# Patient Record
Sex: Male | Born: 1990
Health system: Southern US, Community
[De-identification: ages and names within clinical notes are randomized; demographics above are authoritative.]

## PROBLEM LIST (undated history)

## (undated) DIAGNOSIS — J302 Other seasonal allergic rhinitis: Secondary | ICD-10-CM

## (undated) HISTORY — PX: OTHER SURGICAL HISTORY: SHX169

## (undated) HISTORY — PX: LEG SURGERY: SHX1003

---
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
## 2010-01-30 ENCOUNTER — Emergency Department (HOSPITAL_COMMUNITY): Admission: EM | Admit: 2010-01-30 | Discharge: 2010-01-30 | Payer: Self-pay | Admitting: Emergency Medicine

## 2010-06-06 LAB — GC/CHLAMYDIA PROBE AMP, GENITAL
Chlamydia, DNA Probe: POSITIVE — AB
GC Probe Amp, Genital: POSITIVE — AB

## 2010-07-02 LAB — GC/CHLAMYDIA PROBE AMP, GENITAL
Chlamydia, DNA Probe: NEGATIVE
GC Probe Amp, Genital: POSITIVE — AB

## 2010-07-02 LAB — POCT URINALYSIS DIP (DEVICE)
Bilirubin Urine: NEGATIVE
Glucose, UA: NEGATIVE mg/dL
Ketones, ur: NEGATIVE mg/dL
Nitrite: NEGATIVE
Protein, ur: 30 mg/dL — AB
Specific Gravity, Urine: 1.02 (ref 1.005–1.030)
Urobilinogen, UA: 1 mg/dL (ref 0.0–1.0)
pH: 7.5 (ref 5.0–8.0)

## 2010-07-02 LAB — RPR: RPR Ser Ql: NONREACTIVE

## 2010-07-02 LAB — URINE CULTURE
Colony Count: NO GROWTH
Culture: NO GROWTH

## 2010-07-02 LAB — HIV ANTIBODY (ROUTINE TESTING W REFLEX): HIV: NONREACTIVE

## 2010-08-08 NOTE — Op Note (Signed)
NAMEMarland Kitchen  YARIEL, FERRARIS NO.:  [id number]   MEDICAL RECORD NO.:  [id number]          PATIENT TYPE:  INP   LOCATION:  1615                         FACILITY:  [facility]   PHYSICIAN:  Almedia Balls. Ranell Patrick, M.D. DATE OF BIRTH:  1990-06-22   DATE OF PROCEDURE:  01/17/2008  DATE OF DISCHARGE:                               OPERATIVE REPORT   PREOPERATIVE DIAGNOSIS:  Right displaced tibia-fibula fracture.   POSTOPERATIVE DIAGNOSIS:  Right displaced tibia-fibula fracture.   PROCEDURE PERFORMED:  Intramedullary nailing of right tibia-fibula  fracture.   ATTENDING SURGEON:  Almedia Balls. Ranell Patrick, M.D.   ASSISTANT:  Donnie Coffin. Durwin Nora, P.A.   ANESTHESIA:  General anesthesia was used.   ESTIMATED BLOOD LOSS:  100 mL.   FLUID REPLACEMENT:  1000 cc crystalloid.   Instrument count was correct.  There were no complications.  Perioperative antibiotics were given.   INDICATIONS:  The patient is a 20 year old male who suffered a displaced  tib-fib fracture in a football game last night.  The patient was  admitted and stabilized by Dr. Worthy Rancher, compartments supple  overnight.  The patient now for surgery for stabilization of his  fracture with intramedullary nailing.  Risks, benefits, and alternatives  of surgery discussed with the patient's family and the patient elected  to proceed with surgery.  Informed consent was obtained.   DESCRIPTION OF PROCEDURE:  After an adequate level of anesthesia was  achieved, the patient was positioned supine on a radiolucent table, a  nonsterile tourniquet placed on the right proximal thigh, right leg  sterilely prepped and draped in the usual manner.  The C-arm was  utilized through the case with both AP and lateral views to visualize  reduction and placement of the nail and locking screws.  We did not use  the tourniquet.  The knee was approached through an incision which  started proximal to the joint line, extending up along the patellar  tendon  over to the medial side of the patella.  Dissection was carried  down through subcutaneous tissues using Bovie.  We went in and split the  medial retinaculum.  We did not enter into or violate the joint.  We  identified the starting point for a tibial nail and introduced a guide  pin, visualized in both AP and lateral planes.  We then introduced the  step-cut drill and then used the awl to turn the corner and then placed  a ball-tip guidewire across the fracture site.  We reamed up to a size  10.  This was as tight a fit as we could get, and we measured this to a  34.5 nail.  We then inserted a 9-mm x 34.5 nail antegrade across the  fracture site and in good alignment.  At this point we then placed a  single oblique proximal interlocking screw.  I went ahead and made sure  that the fracture site was compressed and removed the proximal guide and  we placed an end cap, which is a 5-mm end cap.  We then went ahead and  placed our distal interlock screw  in using 4.5 screws using C-arm.  We  were happy with our fracture alignment and we were happy with our screw  placement and also the length of our nail.  At this point we thoroughly  irrigated the wounds, closed  the proximal wound with #1 Vicryl suture, followed by 2-0 Vicryl,  followed by staples, and then the distal wounds with 2-0 Vicryl and  staples.  A sterile compressive bandage and short-leg splint were  applied.  The patient tolerated the surgery well.      Almedia Balls. Ranell Patrick, M.D.  Electronically Signed     SRN/MEDQ  D:  01/17/2008  T:  01/17/2008  Job:  [id number]

## 2010-08-08 NOTE — H&P (Signed)
NAMEMarland Kitchen  Patrick Atkins, Patrick Atkins NO.:  [id number]   MEDICAL RECORD NO.:  [id number]          PATIENT TYPE:  INP   LOCATION:  1615                         FACILITY:  [facility]   PHYSICIAN:  Georges Lynch. Gioffre, M.D.DATE OF BIRTH:  May 01, 1990   DATE OF ADMISSION:  01/16/2008  DATE OF DISCHARGE:                              HISTORY & PHYSICAL   ADMISSION DIAGNOSIS:  Distal right tib-fib fracture from football  injury.   HISTORY OF PRESENT ILLNESS:  The patient is a 20 year old gentleman  playing high school football this evening on defense, took a hit to his  right lower extremity.  He had extreme pain and deformity.  He was  placed in a splint by the athletic trainers, transported to [facility]  Emergency Room where it was found he had an angulated fracture of the  distal tib and fib with displacement.  The patient was given some  narcotics in the emergency room, traction was pulled, it was reduced.  He was placed in a short leg cast which was bivalved.  Postreduction  films shows he has much improved alignment.  Postreduction casting, he  is got good sensation in his toes, good movement.  He is very  comfortable in the cast.   ALLERGIES:  No known drug allergies.   CURRENT MEDICAL HISTORY:  None.   PAST SURGICAL HISTORY:  Right thumb surgery.   SOCIAL HISTORY:  The patient is a high Ec[employer], lives with his  family.   Review of systems is negative for any respiratory, cardiac, general, or  GU problems.   PHYSICAL EXAMINATION:  GENERAL:  The patient is a healthy-appearing,  well-developed 20 year old black male, conscious, alert, and  appropriate, lying on a hospital gurney.  HEENT:  He was normocephalic.  Pupils equal, round, and reactive.  Gross  hearing is intact.  NECK:  Good motion.  CHEST:  Lung sounds were clear throughout.  HEART:  Regular rate and rhythm.  ABDOMEN:  Soft, nontender.  EXTREMITIES:  His upper extremities had full range of motion without  any  deformities or discomfort.  Lower Extremities:  Left lower extremity has  got full range of motion of his hip, knee, and ankle without any  discomfort or deformity.  Right ankle was angulated, swollen.  The  distal foot was neuroendovascularly intact prior to casting.  Post-  casting, his toes were exposed.  They are warm, blanch briskly.  He has  got good sensation.  NEUROLOGIC:  The patient was conscious, alert, and appropriate.   X-rays taken in the ER showed that he had a displaced angulated distal  tib-fib fracture.  Postreduction films shows improved alignment and  reduction.   PLAN:  The patient will be admitted for pain control.  He will also be  taken to the OR the following date by Dr. Malon Kindle for an IM rod of  his right tibia.      Jamelle Rushing, P.A.    ______________________________  Georges Lynch Darrelyn Hillock, M.D.    RWK/MEDQ  D:  01/16/2008  T:  01/17/2008  Job:  [id number]  cc:   Almedia Balls. Ranell Patrick, M.D.

## 2010-08-11 NOTE — Op Note (Signed)
NAME:  Patrick Atkins, Patrick Atkins                ACCOUNT NO.:  [id number]   MEDICAL RECORD NO.:  [id number]          PATIENT TYPE:  AMB   LOCATION:  DSC                          FACILITY:  [facility]   PHYSICIAN:  Rodney A. Mortenson, M.D.DATE OF BIRTH:  24-May-1990   DATE OF PROCEDURE:  11/30/2004  DATE OF DISCHARGE:                                 OPERATIVE REPORT   PREOPERATIVE DIAGNOSIS:  Displaced fracture, base of right thumb metacarpal.   POSTOPERATIVE DIAGNOSIS:  Displaced fracture, base of right thumb  metacarpal.   OPERATION:  Open reduction and internal fixation using pin fixation, right  thumb.   SURGEON:  Lenard Galloway. Chaney Malling, M.D.   ANESTHESIA:  General.   PROCEDURE:  The patient was placed on the operating table in the supine  position.  Pneumatic tourniquet was placed about the right upper extremity.  After satisfactory general anesthesia, the right upper extremity was prepped  with DuraPrep and draped down in the usual manner.  The arm was wrapped with  an Esmarch and a tourniquet was elevated.  Incision was made over the dorsal  ulnar side at the base of the right thumb.  Skin edges were retracted.  Blunt dissection was carried down to the metacarpal itself.  The bone was  almost immediately subcutaneous.  Superficial cutaneous nerves were very  carefully pushed out of the way and protected as was the extensor tendon to  the thumb.   Dissection carried down to the thumb and visualized of the fracture was  achieved.  This could be reduced almost to an anatomic position.  This was  held in a reduced position and two pins were placed across the fracture site  itself.  This stabilized the thumb quite nicely.   The reduction and pin fixation were confirmed with a C-arm throughout the  operative procedure.  The skin edges were then closed with a running 4-0  nylon suture.  Marcaine was placed about the wound.  A large bulky pressure  dressing was applied.  The patient was returned to  the recovery room in  excellent condition, tolerating this procedure extremely well.  I am very  pleased with surgical outcome.   DISPOSITION:  1.  Return to my office on Wednesday.  2.  Percocet for pain.           ______________________________  Lenard Galloway. Chaney Malling, M.D.     RAM/MEDQ  D:  11/30/2004  T:  11/30/2004  Job:  [id number]

## 2010-12-26 LAB — COMPREHENSIVE METABOLIC PANEL
ALT: 20
AST: 34
Albumin: 4.3
Alkaline Phosphatase: 118
BUN: 15
CO2: 22
Calcium: 9.2
Chloride: 108
Creatinine, Ser: 1.12
Glucose, Bld: 120 — ABNORMAL HIGH
Potassium: 3.7
Sodium: 138
Total Bilirubin: 1.2
Total Protein: 6.7

## 2010-12-26 LAB — CBC
HCT: 39.1
HCT: 42.8
Hemoglobin: 12.5
Hemoglobin: 13.5
MCHC: 31.5
MCHC: 31.9
MCV: 72.1 — ABNORMAL LOW
MCV: 73 — ABNORMAL LOW
Platelets: 284
Platelets: 371
RBC: 5.36
RBC: 5.94 — ABNORMAL HIGH
RDW: 13.6
RDW: 15.1
WBC: 14.8 — ABNORMAL HIGH
WBC: 8.6

## 2010-12-26 LAB — DIFFERENTIAL
Basophils Absolute: 0.1
Basophils Relative: 1
Eosinophils Absolute: 0
Eosinophils Relative: 0
Lymphocytes Relative: 10 — ABNORMAL LOW
Lymphs Abs: 1.5
Monocytes Absolute: 0.9
Monocytes Relative: 6
Neutro Abs: 12.3 — ABNORMAL HIGH
Neutrophils Relative %: 83 — ABNORMAL HIGH

## 2010-12-26 LAB — APTT: aPTT: 28

## 2010-12-26 LAB — PROTIME-INR
INR: 1.3
Prothrombin Time: 17.1 — ABNORMAL HIGH

## 2011-08-23 ENCOUNTER — Ambulatory Visit: Payer: 59 | Admitting: Family Medicine

## 2011-08-23 VITALS — BP 133/94 | HR 59 | Temp 98.9°F | Resp 18 | Ht 67.0 in | Wt 188.0 lb

## 2011-08-23 DIAGNOSIS — H109 Unspecified conjunctivitis: Secondary | ICD-10-CM

## 2011-08-23 MED ORDER — POLYMYXIN B-TRIMETHOPRIM 10000-0.1 UNIT/ML-% OP SOLN: 1.0000 [drp] | OPHTHALMIC | Status: AC

## 2011-08-23 NOTE — Progress Notes (Signed)
  Patient Name: Patrick Atkins Date of Birth: 05-17-1990 Medical Record Number: [id number] Gender: male Date of Encounter: 08/23/2011  History of Present Illness:  Patrick Atkins is a 21 y.o. very pleasant male patient who presents with the following:  About 2 days ago he noted a feeling like "an eyelash" was in his right eye.  The eye has continued to be irritated and red- it was stuck together yesterday morning, today more runny.  He did try and remove any FB but did not find anything, today there is a FB sensation Vision is ok.  He wears glasses but not contacts.   Otherwise generally healthy. NKDA Works at [employer]    There is no problem list on file for this patient.  No past medical history on file. No past surgical history on file. History  Substance Use Topics  . Smoking status: Current Everyday Smoker  . Smokeless tobacco: Not on file  . Alcohol Use: Not on file   No family history on file. No Known Allergies  Medication list has been reviewed and updated.  Prior to Admission medications   Not on File    Review of Systems: As per HPI- otherwise negative.   Physical Examination: Filed Vitals:   08/23/11 1156  BP: 133/94  Pulse: 59  Temp: 98.9 F (37.2 C)  Resp: 18   Filed Vitals:   08/23/11 1156  Height: 5\' 7"  (1.702 m)  Weight: 188 lb (85.276 kg)   Body mass index is 29.44 kg/(m^2).  GEN: WDWN, NAD, Non-toxic, A & O x 3 HEENT: Atraumatic, Normocephalic. Neck supple. No masses, No LAD. PEERL, EOMI.  His right eye shows conjunctival redness, discharge, and lid swelling.  Negative fluorecin testing Ears and Nose: No external deformity. CV: RRR, No M/G/R. No JVD. No thrill. No extra heart sounds. PULM: CTA B, no wheezes, crackles, rhonchi. No retractions. No resp. distress. No accessory muscle use. EXTR: No c/c/e NEURO Normal gait.  PSYCH: Normally interactive. Conversant. Not depressed or anxious appearing.  Calm demeanor.    Assessment and Plan: 1.  Conjunctivitis  trimethoprim-polymyxin b (POLYTRIM) ophthalmic solution   Will treat as above- Patient (or parent if minor) instructed to return to clinic or call if not better in 2 day(s).  Gave note for work also.     Abbe Amsterdam, MD 08/23/2011 12:31 PM

## 2011-09-06 ENCOUNTER — Ambulatory Visit (INDEPENDENT_AMBULATORY_CARE_PROVIDER_SITE_OTHER): Payer: 59 | Admitting: Family Medicine

## 2011-09-06 VITALS — BP 139/78 | HR 54 | Temp 98.0°F | Resp 16 | Ht 66.78 in | Wt 183.2 lb

## 2011-09-06 DIAGNOSIS — H1010 Acute atopic conjunctivitis, unspecified eye: Secondary | ICD-10-CM

## 2011-09-06 DIAGNOSIS — H1045 Other chronic allergic conjunctivitis: Secondary | ICD-10-CM

## 2011-09-06 MED ORDER — AZELASTINE HCL 0.05 % OP SOLN: 1.0000 [drp] | Freq: Two times a day (BID) | OPHTHALMIC | Status: AC

## 2011-09-06 NOTE — Progress Notes (Signed)
  Subjective:    Patient ID: Patrick Atkins, male    DOB: Aug 14, 1990, 21 y.o.   MRN: [id number]  HPI Patrick Atkins is a 21 y.o. male Seen 08/23/11 for R conjunctivitis, treated with polytrim gtts.   Noted in L eye few days later.    Drops in both eyes.  Now with sensitivity to light.  Red at times. Working at night.  Hurt during day.  Watery eyes.  No sneezing.  No fever.  Vision ok with glasses, but eyelids heavy. Still using polytrim - up to every 3 -4 hours.    Review of Systems  Constitutional: Negative for fever and chills.  HENT: Negative for congestion, postnasal drip and sinus pressure.   Respiratory: Negative for cough.   Skin: Negative for rash.       Objective:   Physical Exam  Constitutional: He is oriented to person, place, and time. He appears well-developed and well-nourished.  HENT:  Head: Normocephalic and atraumatic.  Right Ear: External ear normal.  Left Ear: External ear normal.  Nose: Nose normal.  Mouth/Throat: No oropharyngeal exudate.  Eyes: EOM are normal. Pupils are equal, round, and reactive to light. Right eye exhibits discharge. Right conjunctiva is injected. Left conjunctiva is injected.       R eye watering/clear d/c.  Pulmonary/Chest: Effort normal.  Neurological: He is alert and oriented to person, place, and time.  Skin: Skin is warm and dry.  Psychiatric: He has a normal mood and affect.          Assessment & Plan:  Patrick Atkins is a 21 y.o. male 1. Conjunctivitis, allergic    Stop abx drops and otc red eye drops.  May be causing secondary irritation.  Stop marijuana use. rtc precautions. Patient Instructions  Stop antibiotic drops and red eye drops.  Start allergy drops, and Refresh eye drops as needed throughout the day.  If not improving in 4-5 days, recheck.  If any discolored drainage from the eyes, or problems with your vision - recheck sooner.

## 2011-09-06 NOTE — Patient Instructions (Signed)
Stop antibiotic drops and red eye drops.  Start allergy drops, and Refresh eye drops as needed throughout the day.  If not improving in 4-5 days, recheck.  If any discolored drainage from the eyes, or problems with your vision - recheck sooner.

## 2011-11-23 ENCOUNTER — Ambulatory Visit: Payer: 59 | Admitting: Family Medicine

## 2011-11-23 VITALS — BP 142/86 | HR 53 | Temp 97.4°F | Resp 16 | Ht 67.0 in | Wt 188.0 lb

## 2011-11-23 DIAGNOSIS — T148 Other injury of unspecified body region: Secondary | ICD-10-CM

## 2011-11-23 DIAGNOSIS — L0390 Cellulitis, unspecified: Secondary | ICD-10-CM

## 2011-11-23 DIAGNOSIS — L0291 Cutaneous abscess, unspecified: Secondary | ICD-10-CM

## 2011-11-23 DIAGNOSIS — W57XXXA Bitten or stung by nonvenomous insect and other nonvenomous arthropods, initial encounter: Secondary | ICD-10-CM

## 2011-11-23 MED ORDER — SULFAMETHOXAZOLE-TRIMETHOPRIM 800-160 MG PO TABS: 1.0000 | ORAL_TABLET | Freq: Two times a day (BID) | ORAL | Status: AC

## 2011-11-23 NOTE — Patient Instructions (Addendum)
Return if in all worse or if it is just not improving.  Take the antibiotics faithfully for the full 10 days

## 2011-11-23 NOTE — Progress Notes (Signed)
Subjective: Patient has developed a soreness left wrist. He woke up with what he thought was a sting there. He's been cleaning it with alcohol and then with peroxide. It has developed a little cavity and draining.  Objective: Abscess left wrist which is draining. Culture was taken. No other lesions.  Assessment: Skin abscess, probably MRSA Possible insect bite originally  Plan: Bactrim DS twice a day Keep it covered at work especially. He is to wash and clean and a couple times a day and dress it.

## 2011-11-27 LAB — WOUND CULTURE
Gram Stain: NONE SEEN
Gram Stain: NONE SEEN
Gram Stain: NONE SEEN

## 2012-09-09 ENCOUNTER — Emergency Department (HOSPITAL_COMMUNITY): Payer: 59

## 2012-09-09 ENCOUNTER — Encounter (HOSPITAL_COMMUNITY): Payer: Self-pay | Admitting: Emergency Medicine

## 2012-09-09 ENCOUNTER — Emergency Department (HOSPITAL_COMMUNITY)
Admission: EM | Admit: 2012-09-09 | Discharge: 2012-09-09 | Disposition: A | Discharge status: Home / Self Care | Payer: 59 | Attending: Emergency Medicine | Admitting: Emergency Medicine

## 2012-09-09 DIAGNOSIS — Z23 Encounter for immunization: Secondary | ICD-10-CM | POA: Insufficient documentation

## 2012-09-09 DIAGNOSIS — F17200 Nicotine dependence, unspecified, uncomplicated: Secondary | ICD-10-CM | POA: Insufficient documentation

## 2012-09-09 DIAGNOSIS — IMO0002 Reserved for concepts with insufficient information to code with codable children: Secondary | ICD-10-CM

## 2012-09-09 DIAGNOSIS — S058X9A Other injuries of unspecified eye and orbit, initial encounter: Secondary | ICD-10-CM | POA: Insufficient documentation

## 2012-09-09 HISTORY — DX: Other seasonal allergic rhinitis: J30.2

## 2012-09-09 MED ORDER — IBUPROFEN 800 MG PO TABS
800.0000 mg | ORAL_TABLET | Freq: Once | ORAL | Status: AC
  Administered 2012-09-09: 800 mg via ORAL
  Filled 2012-09-09: qty 1

## 2012-09-09 MED ORDER — IBUPROFEN 800 MG PO TABS: 800.0000 mg | ORAL_TABLET | Freq: Three times a day (TID) | ORAL | Status: DC

## 2012-09-09 MED ORDER — BACITRACIN ZINC 500 UNIT/GM EX OINT
1.0000 "application " | TOPICAL_OINTMENT | Freq: Once | CUTANEOUS | Status: AC
  Administered 2012-09-09: 1 via TOPICAL

## 2012-09-09 MED ORDER — TETANUS-DIPHTH-ACELL PERTUSSIS 5-2.5-18.5 LF-MCG/0.5 IM SUSP
0.5000 mL | Freq: Once | INTRAMUSCULAR | Status: AC
  Administered 2012-09-09: 0.5 mL via INTRAMUSCULAR
  Filled 2012-09-09: qty 0.5

## 2012-09-09 NOTE — ED Notes (Signed)
The patient is AOx4 and comfortable with the discharge instructions. 
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
## 2012-09-09 NOTE — ED Notes (Signed)
MD at bedside. 

## 2012-10-12 ENCOUNTER — Emergency Department (HOSPITAL_BASED_OUTPATIENT_CLINIC_OR_DEPARTMENT_OTHER)
Admission: EM | Admit: 2012-10-12 | Discharge: 2012-10-12 | Disposition: A | Discharge status: Home / Self Care | Payer: 59 | Attending: Emergency Medicine | Admitting: Emergency Medicine

## 2012-10-12 ENCOUNTER — Encounter (HOSPITAL_BASED_OUTPATIENT_CLINIC_OR_DEPARTMENT_OTHER): Payer: Self-pay | Admitting: Emergency Medicine

## 2012-10-12 DIAGNOSIS — Z4802 Encounter for removal of sutures: Secondary | ICD-10-CM | POA: Insufficient documentation

## 2012-10-12 DIAGNOSIS — F17200 Nicotine dependence, unspecified, uncomplicated: Secondary | ICD-10-CM | POA: Insufficient documentation

## 2012-10-12 NOTE — ED Notes (Signed)
Pt has sutures above the left eye directly below eyebrow. Pt states he was supposed to get them removed a month ago. Pt A/Ox4, denies pain, denies any other medical problem.

## 2012-10-12 NOTE — ED Provider Notes (Signed)
   History    CSN: [id number] Arrival date & time 10/12/12  1152  First MD Initiated Contact with Patient 10/12/12 1251     Chief Complaint  Patient presents with  . Suture / Staple Removal   (Consider location/radiation/quality/duration/timing/severity/associated sxs/prior Treatment) HPI Comments: Patient presents for suture removal. Patient had 4 sutures placed above the left eyebrow on 09/09/12. He reports no complications with wound healing. Onset of symptoms acute. Course is constant. Nothing makes symptoms better or worse.  Patient is a 22 y.o. male presenting with suture removal. The history is provided by the patient.  Suture / Staple Removal Pertinent negatives include no fever, headaches, myalgias, nausea, rash or vomiting.   Past Medical History  Diagnosis Date  . Seasonal allergies    History reviewed. No pertinent past surgical history. History reviewed. No pertinent family history. History  Substance Use Topics  . Smoking status: Current Every Day Smoker  . Smokeless tobacco: Not on file  . Alcohol Use: Yes    Review of Systems  Constitutional: Negative for fever.  Gastrointestinal: Negative for nausea and vomiting.  Musculoskeletal: Negative for myalgias.  Skin: Negative for rash.  Neurological: Negative for headaches.    Allergies  Review of patient's allergies indicates no known allergies.  Home Medications   Current Outpatient Rx  Name  Route  Sig  Dispense  Refill  . ibuprofen (ADVIL,MOTRIN) 800 MG tablet   Oral   Take 1 tablet (800 mg total) by mouth 3 (three) times daily.   21 tablet   0    BP 149/64  Pulse 59  Temp(Src) 98.5 F (36.9 C) (Oral)  Resp 18  Ht 5\' 6"  (1.676 m)  Wt 188 lb (85.276 kg)  BMI 30.36 kg/m2  SpO2 100%  Physical Exam  Nursing note and vitals reviewed. Constitutional: He appears well-developed and well-nourished.  HENT:  Head: Normocephalic and atraumatic.  Eyes: Conjunctivae are normal.  Neck: Normal range  of motion. Neck supple.  Pulmonary/Chest: No respiratory distress.  Neurological: He is alert.  Skin: Skin is warm and dry.  Well healing wounds superior to left eyebrow with 4 Prolene sutures in place. No drainage or signs of infection.  Psychiatric: He has a normal mood and affect.    ED Course  Procedures (including critical care time) Labs Reviewed - No data to display No results found. 1. Visit for suture removal     1:23 PM Patient seen and examined. 4 Prolene sutures removed without complication.   Vital signs reviewed and are as follows: Filed Vitals:   10/12/12 1203  BP: 149/64  Pulse: 59  Temp: 98.5 F (36.9 C)  Resp: 18      MDM  Visit for suture removal.  Renne Crigler, PA-C 10/12/12 1327

## 2012-10-13 NOTE — ED Provider Notes (Signed)
Medical screening examination/treatment/procedure(s) were performed by non-physician practitioner and as supervising physician I was immediately available for consultation/collaboration.   Calleen Alvis W. Jakobi Thetford, MD 10/13/12 1005 

## 2013-04-26 ENCOUNTER — Encounter (HOSPITAL_COMMUNITY): Payer: Self-pay | Admitting: Emergency Medicine

## 2013-04-26 ENCOUNTER — Emergency Department (HOSPITAL_COMMUNITY)
Admission: EM | Admit: 2013-04-26 | Discharge: 2013-04-26 | Disposition: A | Discharge status: Home / Self Care | Payer: 59 | Attending: Emergency Medicine | Admitting: Emergency Medicine

## 2013-04-26 DIAGNOSIS — K0889 Other specified disorders of teeth and supporting structures: Secondary | ICD-10-CM

## 2013-04-26 DIAGNOSIS — Z8709 Personal history of other diseases of the respiratory system: Secondary | ICD-10-CM | POA: Insufficient documentation

## 2013-04-26 DIAGNOSIS — K089 Disorder of teeth and supporting structures, unspecified: Secondary | ICD-10-CM | POA: Insufficient documentation

## 2013-04-26 DIAGNOSIS — F17200 Nicotine dependence, unspecified, uncomplicated: Secondary | ICD-10-CM | POA: Insufficient documentation

## 2013-04-26 DIAGNOSIS — Z791 Long term (current) use of non-steroidal anti-inflammatories (NSAID): Secondary | ICD-10-CM | POA: Insufficient documentation

## 2013-04-26 MED ORDER — PENICILLIN V POTASSIUM 500 MG PO TABS: 500.0000 mg | ORAL_TABLET | Freq: Four times a day (QID) | ORAL | Status: AC

## 2013-04-26 MED ORDER — HYDROCODONE-ACETAMINOPHEN 7.5-325 MG/15ML PO SOLN: 10.0000 mL | ORAL | Status: AC | PRN

## 2013-04-26 NOTE — ED Provider Notes (Signed)
Medical screening examination/treatment/procedure(s) were performed by non-physician practitioner and as supervising physician I was immediately available for consultation/collaboration.  EKG Interpretation   None         Gwyneth SproutWhitney Tashae Inda, MD 04/26/13 [phone]

## 2013-04-26 NOTE — Discharge Instructions (Signed)
Read the information below.  Use the prescribed medication as directed.  Please discuss all new medications with your pharmacist.  Do not take additional tylenol while taking the prescribed pain medication to avoid overdose.  You may return to the Emergency Department at any time for worsening condition or any new symptoms that concern you.  Please call the dentist listed above within 48 hours to schedule a close follow up appointment.  If you develop fevers, swelling in your face, difficulty swallowing or breathing, return to the ER immediately for a recheck.     Dental Pain A tooth ache may be caused by cavities (tooth decay). Cavities expose the nerve of the tooth to air and hot or cold temperatures. It may come from an infection or abscess (also called a boil or furuncle) around your tooth. It is also often caused by dental caries (tooth decay). This causes the pain you are having. DIAGNOSIS  Your caregiver can diagnose this problem by exam. TREATMENT   If caused by an infection, it may be treated with medications which kill germs (antibiotics) and pain medications as prescribed by your caregiver. Take medications as directed.  Only take over-the-counter or prescription medicines for pain, discomfort, or fever as directed by your caregiver.  Whether the tooth ache today is caused by infection or dental disease, you should see your dentist as soon as possible for further care. SEEK MEDICAL CARE IF: The exam and treatment you received today has been provided on an emergency basis only. This is not a substitute for complete medical or dental care. If your problem worsens or new problems (symptoms) appear, and you are unable to meet with your dentist, call or return to this location. SEEK IMMEDIATE MEDICAL CARE IF:   You have a fever.  You develop redness and swelling of your face, jaw, or neck.  You are unable to open your mouth.  You have severe pain uncontrolled by pain medicine. MAKE  SURE YOU:   Understand these instructions.  Will watch your condition.  Will get help right away if you are not doing well or get worse. Document Released: 03/12/2005 Document Revised: 06/04/2011 Document Reviewed: 10/29/2007 [facility] Patient Information 2014 [employer], [place].  Tooth Injuries The 3 most common tooth injuries are 1) fracture, 2) luxation (dislocated), and 3) avulsion (entire tooth comes out). Fracture. A fracture usually splits the tooth into 2 or more parts. Part of the tooth stays attached to the socket and 1 or more pieces of the tooth break free.  When the flesh inside the tooth (pulp) is injured, it can be identified by bleeding at the site or a pink or red dot in the dentin. Dentin is the yellowish second layer usually covered by enamel. Problems involving the dentin may be painful because the junction of the enamel and dentin is very sensitive. Limiting exposure of air, fluid in the mouth (saliva), temperature changes, and the tongue to tooth pulp will decrease the pain.  Fractures may be classified as:  Crown fractures. A crown fracture may involve the enamel only or the enamel and dentin. Sometimes crown fractures are broken down as simple (no pulp involvement) or complex (pulp involvement).  Root fractures. Root fractures almost always involve the pulp.  A combination of both. Luxation. Luxation means the tooth becomes dislocated within the socket but maintains some attachment. There are different types of luxations. They be identified by teeth that appear:  Longer than the surrounding teeth (extruded).  Positioned ahead of or behind  the normal tooth row (laterally displaced). With either injury, the tooth should be firmly grasped with a gloved hand and moved into its normal position. If the procedure is too difficult or too painful, the tooth should be left where it is for a dentist to reposition.   Pushed into the gum and appears shorter than the surrounding  teeth (intruded). Do not attempt to reposition an intruded tooth. With all luxated teeth, get to a dentist as soon as possible. Avulsion. Avulsion means the entire tooth is removed from its socket. The best outcomes require putting the tooth back in place within 60 minutes. After 2 hours, the chances of saving the tooth are small but getting to a dentist right away can be beneficial. Locate and protect the lost tooth. The tooth will often still be in the mouth, but if it cannot be located, check clothing, and the surrounding area. If dirty, the tooth should be gently cleaned with water or salty water (saline). To make saline combine  teaspoon of table salt in a cup of warm water. The tooth should be handled only on its enamel surface. The root should be protected from further injury. If the tooth cannot be repositioned into the socket after cleaning, transport the tooth in saliva, distilled water, or milk to the dentist. Avulsed baby (primary) teeth should not be reimplanted. TREATMENT   Gently biting into gauze or a towel will help control the bleeding. An exposed nerve requires dental exam and care.  Tooth fragments should be handled on their enamel surfaces, saved, and sent to the dental office with the injured person.  Minor fractures, involving only the enamel, usually do not require immediate dental treatment. A tooth can also be loosened by injury with no visible fracture or displacement. A person with this injury should be referred to a dentist for an X-ray exam to look for tooth fractures below the gum line.  Root fractures require joining the fractured tooth to a healthy tooth (splinting) by a dentist as soon as possible. If splinting is not possible, extraction of the remaining tooth may be necessary.  Only take over-the-counter or prescription medicines for pain, discomfort, or fever as directed by your caregiver.  If you are given antibiotics, finish all of them as directed. RISKS AND  COMPLICATIONS Complications from tooth injuries can include:  Tooth death.  Tooth loss.  Cosmetic deformity.  Infection. PREVENTION  Mouth guards should be worn in all contact sports. SEEK DENTAL CARE IF:  Pain becomes worse rather than better, or if pain is uncontrolled with medications.  You have increased swelling or redness in your face near the injured tooth.  You have an oral temperature above 102 F (38.9 C) not controlled by medicine.  You cannot open your mouth. Document Released: 12/08/2003 Document Revised: 06/04/2011 Document Reviewed: 06/16/2009 [facility] Patient Information 2014 [place], [place].

## 2013-04-26 NOTE — ED Provider Notes (Signed)
CSN: [id number]     Arrival date & time 04/26/13  0654 History   First MD Initiated Contact with Patient 04/26/13 [phone]     Chief Complaint  Patient presents with  . Dental Pain   (Consider location/radiation/quality/duration/timing/severity/associated sxs/prior Treatment) HPI Patient presents with left lower molar pain x 3 days.  Denies any known injury to the tooth but has had difficulty with this tooth in the past only when eating candy.  Three days ago he developed intermittent sharp pains in the tooth that are preventing him from eating.  Pain radiates into her left jaw.  Took aspirin and used orajel without relief.  Denies fevers, chills, sore throat, difficulty swallowing or breathing.  Denies facial swelling. Has a dentist Ned CardStacey Greene, plans to call her tomorrow to schedule an appointment.   Past Medical History  Diagnosis Date  . Seasonal allergies    History reviewed. No pertinent past surgical history. No family history on file. History  Substance Use Topics  . Smoking status: Current Every Day Smoker  . Smokeless tobacco: Not on file  . Alcohol Use: Yes    Review of Systems  Constitutional: Negative for fever, chills and appetite change.  HENT: Positive for dental problem. Negative for facial swelling, sore throat and trouble swallowing.   Respiratory: Negative for shortness of breath.   Musculoskeletal: Negative for neck stiffness.  Allergic/Immunologic: Negative for immunocompromised state.    Allergies  Review of patient's allergies indicates no known allergies.  Home Medications   Current Outpatient Rx  Name  Route  Sig  Dispense  Refill  . HYDROcodone-acetaminophen (HYCET) 7.5-325 mg/15 ml solution   Oral   Take 10 mLs by mouth every 4 (four) hours as needed for moderate pain.   150 mL   0   . ibuprofen (ADVIL,MOTRIN) 800 MG tablet   Oral   Take 1 tablet (800 mg total) by mouth 3 (three) times daily.   21 tablet   0   . penicillin v potassium  (VEETID) 500 MG tablet   Oral   Take 1 tablet (500 mg total) by mouth 4 (four) times daily.   40 tablet   0    BP 147/92  Pulse 69  Temp(Src) 97.6 F (36.4 C) (Oral)  Resp 16  SpO2 99% Physical Exam  Nursing note and vitals reviewed. Constitutional: He appears well-developed and well-nourished. No distress.  HENT:  Head: Normocephalic and atraumatic.  Mouth/Throat: Oropharynx is clear and moist. No oropharyngeal exudate.    Neck: Neck supple.  Pulmonary/Chest: Effort normal.  Neurological: He is alert.  Skin: He is not diaphoretic.    ED Course  Procedures (including critical care time) Labs Review Labs Reviewed - No data to display Imaging Review No results found.  EKG Interpretation   None       MDM   1. Pain, dental    Afebrile, nontoxic patient with new dental pain.  No obvious abscess.  Patient appears to have fracture of tooth, previously unknown.  No obvious abscess but pt with new pain in the setting of fractured tooth. Doubt deep space head or neck infection.  Doubt Ludwig's angina.  D/C home with pain medication and dental follow up.  Discussed findings, treatment, and follow up  with patient.  Pt given return precautions.  Pt verbalizes understanding and agrees with plan.        ScottsbluffEmily Clancy Mullarkey, PA-C 04/26/13 [phone]

## 2013-04-26 NOTE — ED Notes (Signed)
Pt. reports left lower molar pain for several days unrelieved by OTC pain medications .

## 2014-12-23 IMAGING — CT CT MAXILLOFACIAL W/O CM
3 series · 17 of 47 positions shown, 20 images · non-contrast
Comparison: Head CT 11/28/2006.

CLINICAL DATA: History of trauma.  Laceration over the left eye.

CT MAXILLOFACIAL WITHOUT CONTRAST
TECHNIQUE: Multidetector CT imaging of the maxillofacial
structures was performed. Multiplanar CT image reconstructions were
also generated.

[Series 3: orbit/facial 2.0 h30s · axial · 0.35mm/px · z∈[+1044,+1196]mm · 11 of 90 slices shown, 14 images]
[im 7/90  brain]
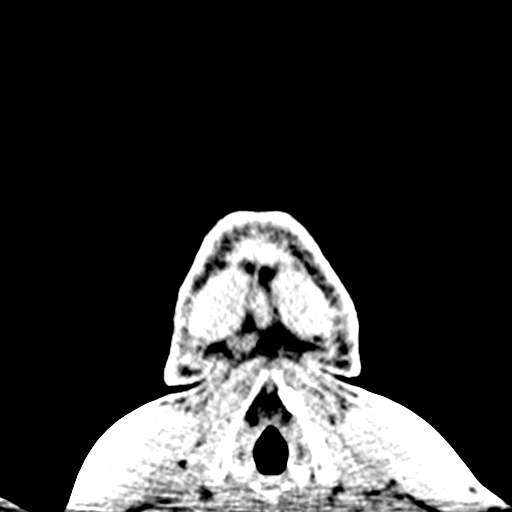
[im 7/90  bone]
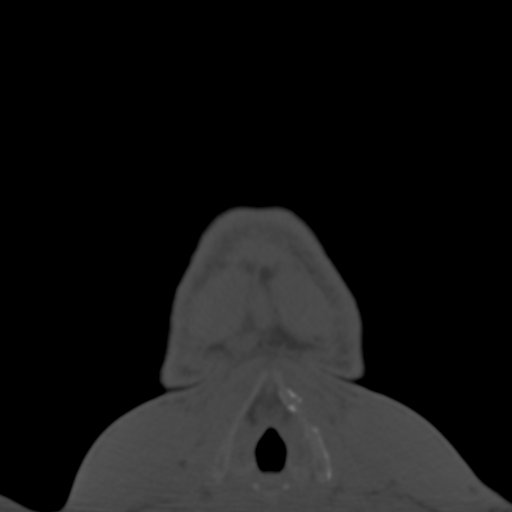
[im 13/90  bone]
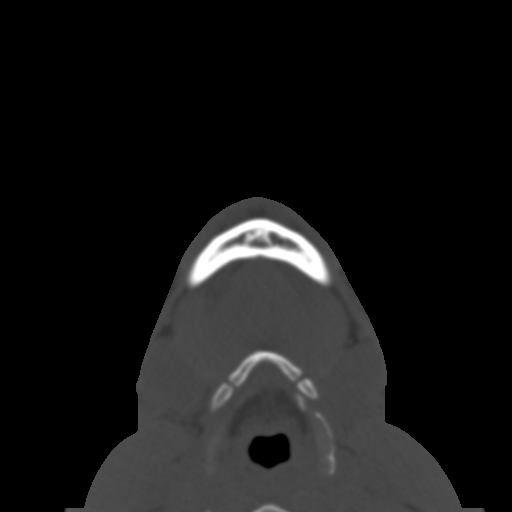
[im 22/90  bone]
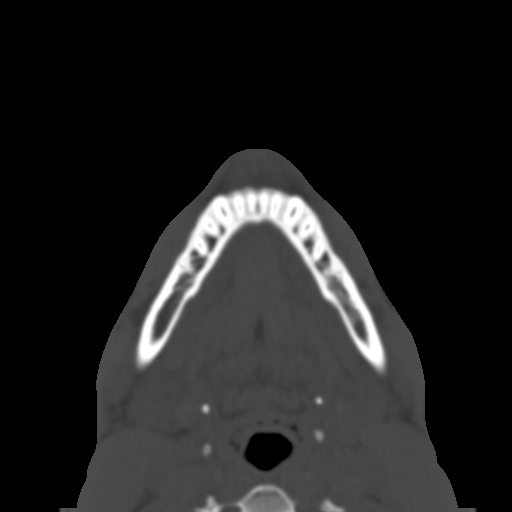
[im 28/90  bone]
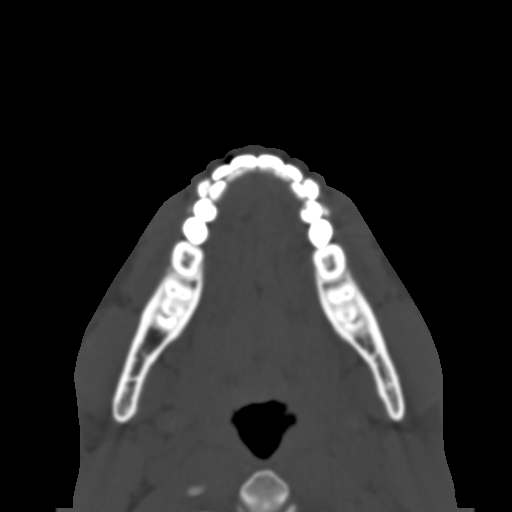
[im 37/90  brain]
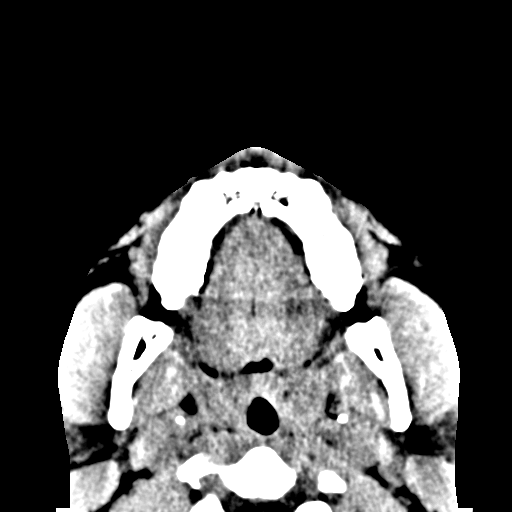
[im 37/90  bone]
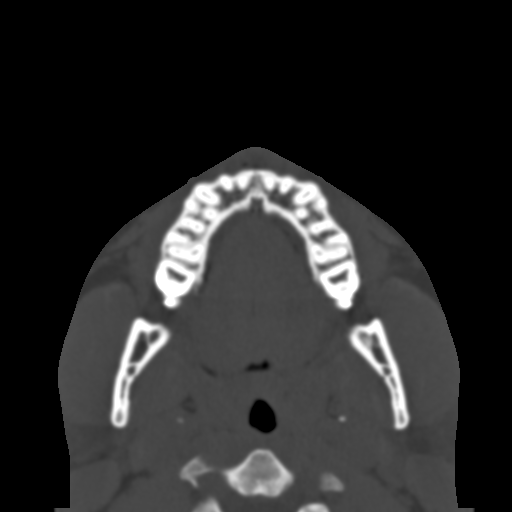
[im 47/90  bone]
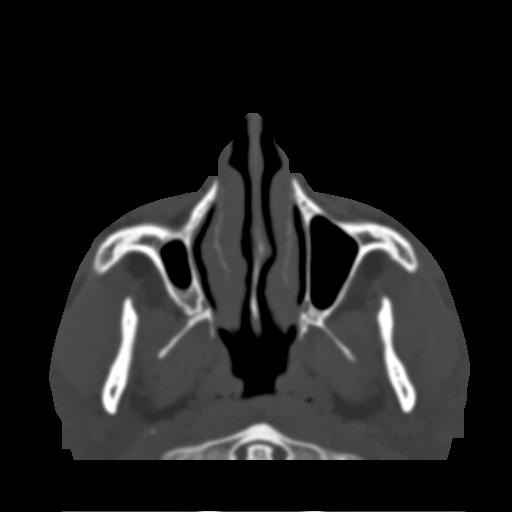
[im 53/90  bone]
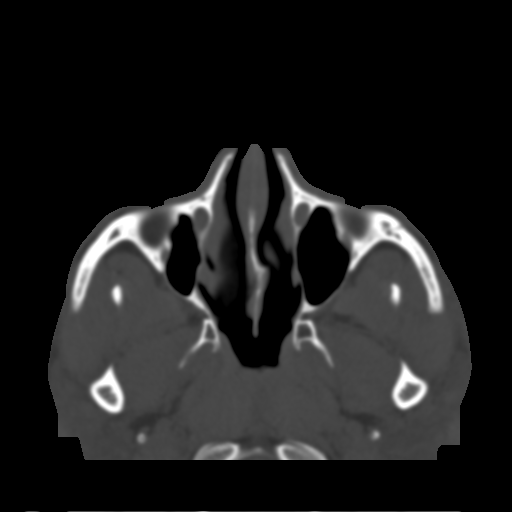
[im 62/90  bone]
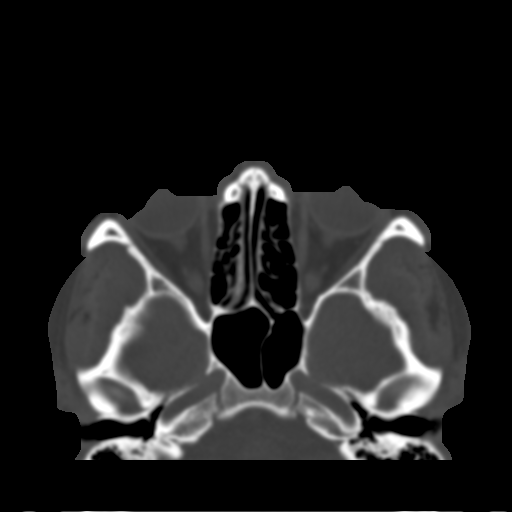
[im 68/90  brain]
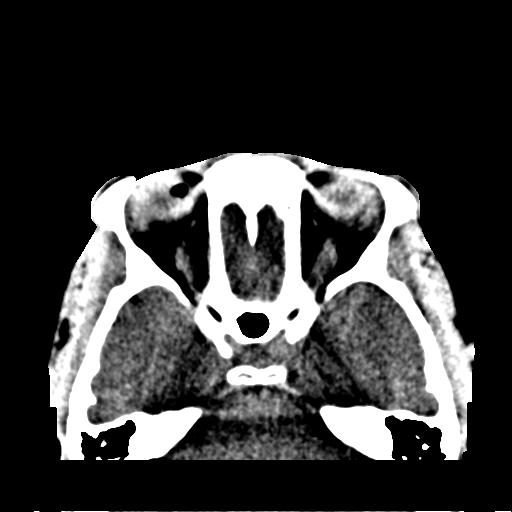
[im 68/90  bone]
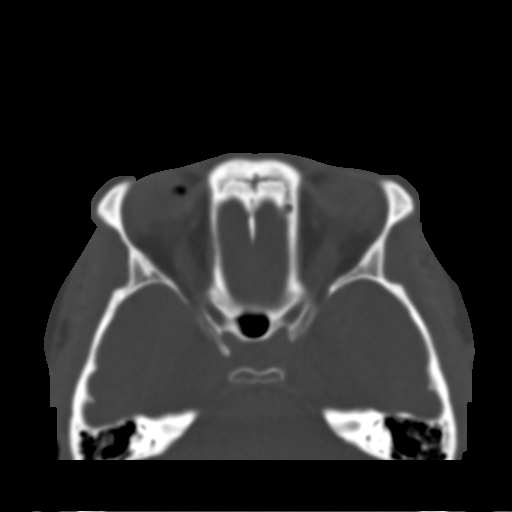
[im 77/90  bone]
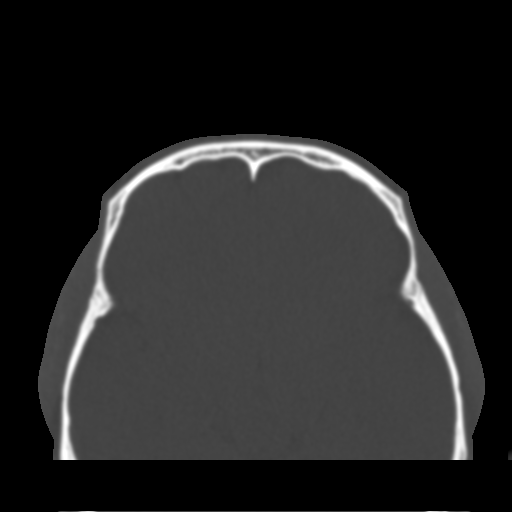
[im 83/90  bone]
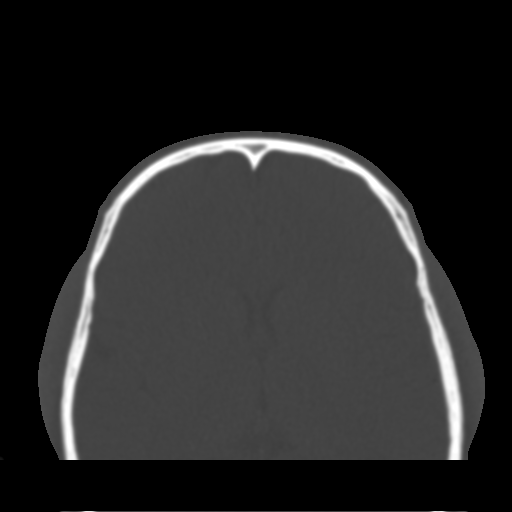

[Series 602: cor · coronal · 0.35mm/px · 3 of 67 slices shown]
[im 23/67  bone]
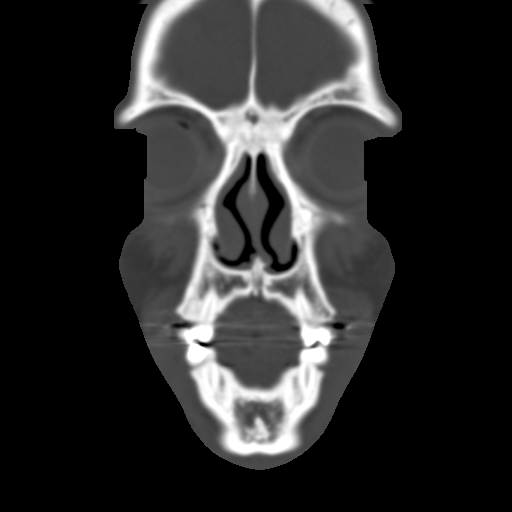
[im 30/67  bone]
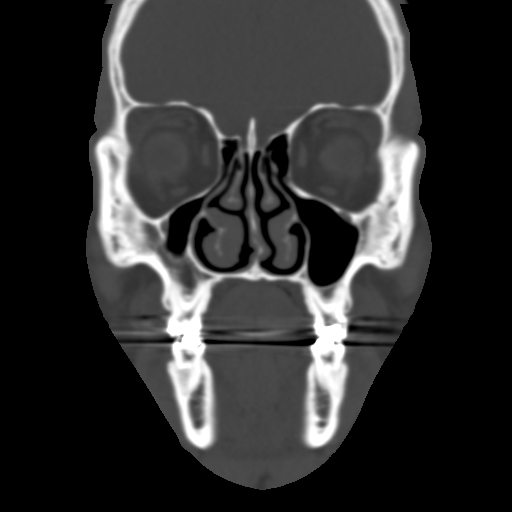
[im 37/67  bone]
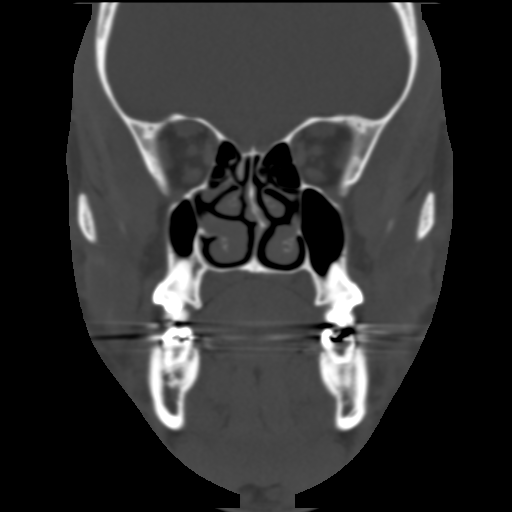

[Series 603: <mpr thick range> · sagittal · 0.35mm/px · 3 of 75 slices shown]
[im 25/75  bone]
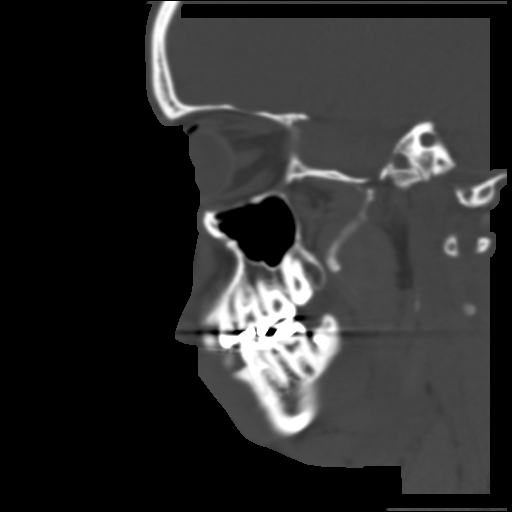
[im 38/75  bone]
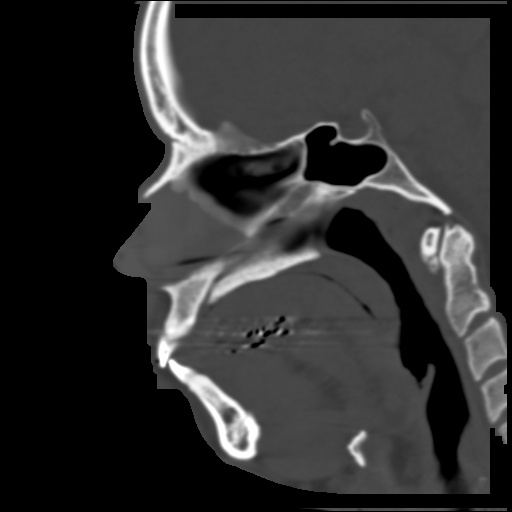
[im 50/75  bone]
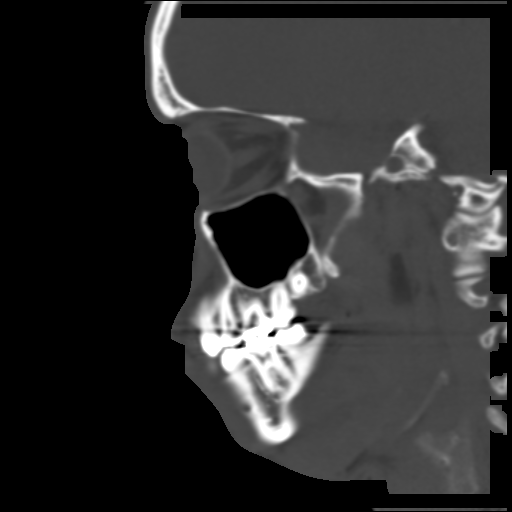

[17 of 47 positions shown; findings below may reference images not displayed]

FINDINGS: No acute displaced facial bone fractures.  Pterygoid
plates are intact.  Mandibular condyles are located bilaterally.
Visualized paranasal sinuses and mastoids are well pneumatized.
Bilateral globes and retro-orbital soft tissues are unremarkable.
IMPRESSION: 1.  No evidence of significant acute traumatic injury to the facial
bones.

## 2016-12-03 ENCOUNTER — Ambulatory Visit (HOSPITAL_COMMUNITY)
Admission: EM | Admit: 2016-12-03 | Discharge: 2016-12-03 | Disposition: A | Discharge status: Home / Self Care | Payer: BLUE CROSS/BLUE SHIELD | Attending: Family Medicine | Admitting: Family Medicine

## 2016-12-03 ENCOUNTER — Encounter (HOSPITAL_COMMUNITY): Payer: Self-pay | Admitting: Emergency Medicine

## 2016-12-03 DIAGNOSIS — B070 Plantar wart: Secondary | ICD-10-CM | POA: Insufficient documentation

## 2016-12-03 DIAGNOSIS — M79671 Pain in right foot: Secondary | ICD-10-CM | POA: Diagnosis present

## 2016-12-03 DIAGNOSIS — A609 Anogenital herpesviral infection, unspecified: Secondary | ICD-10-CM | POA: Insufficient documentation

## 2016-12-03 DIAGNOSIS — N4889 Other specified disorders of penis: Secondary | ICD-10-CM | POA: Diagnosis present

## 2016-12-03 MED ORDER — ACYCLOVIR 200 MG/5ML PO SUSP: 800.0000 mg | Freq: Every day | ORAL | 0 refills | Status: AC

## 2016-12-03 NOTE — Discharge Instructions (Signed)
Your rash is consistent with herpes. We have taken viral cultures to confirm. I have started you on acyclovir, 20 mls, 5 times daily for 5 days. For your plantar wart, they are wart remover products available over-the-counter at most pharmacies. You can use these according to the directions on the label, or I provided contact information for a podiatrist. This is a foot doctor who should be able to help you with your symptoms. You'll be notified of any positive results on any of the tests run today within 3-5 business days.

## 2016-12-03 NOTE — ED Triage Notes (Signed)
Patient has rash in groin area.  Denies penile discharge.  Rash noticed on Friday Noticed pain approx 2 weeks ago, sensitive sensation  Left foot pain .patient reports callused areas that are painful.

## 2016-12-03 NOTE — ED Provider Notes (Signed)
  [facility]   [id number] 12/03/16 Arrival Time: 1008   SUBJECTIVE:  Patrick Atkins is a 26 y.o. male who presents to the urgent care with complaint of right foot pain for 2 weeks, along with penile that has been present since Friday. Describes her rash is painful. No dysuria, frequency, urgency, or penile discharge. No flank pain, abdominal pain, or back pain. He is sexually active with one male partner, who is asymptomatic.     Past Medical History:  Diagnosis Date  . Seasonal allergies    No family history on file. Social History   Social History  . Marital status: Single    Spouse name: N/A  . Number of children: N/A  . Years of education: N/A   Occupational History  . Not on file.   Social History Main Topics  . Smoking status: Never Smoker  . Smokeless tobacco: Not on file  . Alcohol use Yes  . Drug use: Yes    Frequency: 2.0 times per week    Types: Marijuana  . Sexual activity: Not on file   Other Topics Concern  . Not on file   Social History Narrative  . No narrative on file   No outpatient prescriptions have been marked as taking for the 12/03/16 encounter [facility](Hospital Encounter).   No Known Allergies    ROS: As per HPI, remainder of ROS negative.   OBJECTIVE:   Vitals:   12/03/16 1110  BP: (!) 153/90  Pulse: 71  Resp: 18  Temp: 97.9 F (36.6 C)  TempSrc: Oral  SpO2: 100%     General appearance: alert; no distress Eyes: PERRL; EOMI; conjunctiva normal HENT: normocephalic; atraumatic; external ears normal without trauma; Neck: No JVD noted Lungs: clear to auscultation bilaterally Heart: regular rate and rhythm Abdomen: soft, non-tender; bowel sounds normal; no masses or organomegaly; no guarding or rebound tenderness JY:NWGNFAOZHGU:erythemic vesicular lesions along the shaft of the penis. Back: no CVA tenderness Extremities: no cyanosis or edema; symmetrical with no gross deformities, plantar wart left foot at the base of the first and  second toe Skin: warm and dry Neurologic: normal gait; grossly normal Psychological: alert and cooperative; normal mood and affect      Labs:   Labs Reviewed  URINE CYTOLOGY ANCILLARY ONLY    No results found.     ASSESSMENT & PLAN:  1. HSV (herpes simplex virus) anogenital infection   2. Plantar wart     Meds ordered this encounter  Medications  . acyclovir (ZOVIRAX) 200 MG/5ML suspension    Sig: Take 20 mLs (800 mg total) by mouth 5 (five) times daily.    Dispense:  700 mL    Refill:  0    Order Specific Question:   Supervising Provider    Answer:   Mardella LaymanHAGLER, BRIAN [[id number]]   Testing done for gonorrhea, chlamydia, Trichomonas, along with viral culture for HSV. Started on acyclovir. He is requesting liquid medication, stating he cannot swallow pills. Plantar wart: provided counseling on over-the-counter nonpharmacologic therapies for symptom management, along with referral to podiatry  Reviewed expectations re: course of current medical issues. Questions answered. Outlined signs and symptoms indicating need for more acute intervention. Patient verbalized understanding. After Visit Summary given.    Procedures:        Dorena BodoKennard, Deborah Dondero, NP 12/03/16 1232

## 2016-12-04 LAB — URINE CYTOLOGY ANCILLARY ONLY
Chlamydia: NEGATIVE
Neisseria Gonorrhea: NEGATIVE
Trichomonas: NEGATIVE

## 2016-12-05 LAB — HSV CULTURE AND TYPING

## 2017-04-01 ENCOUNTER — Other Ambulatory Visit: Payer: Self-pay

## 2017-04-01 ENCOUNTER — Encounter: Payer: Self-pay | Admitting: Physician Assistant

## 2017-04-01 ENCOUNTER — Ambulatory Visit: Payer: BLUE CROSS/BLUE SHIELD | Admitting: Physician Assistant

## 2017-04-01 VITALS — BP 146/90 | HR 68 | Temp 98.6°F | Resp 16 | Ht 66.5 in | Wt 205.6 lb

## 2017-04-01 DIAGNOSIS — B009 Herpesviral infection, unspecified: Secondary | ICD-10-CM | POA: Diagnosis not present

## 2017-04-01 DIAGNOSIS — Z23 Encounter for immunization: Secondary | ICD-10-CM

## 2017-04-01 MED ORDER — VALACYCLOVIR HCL 1 G PO TABS: ORAL_TABLET | ORAL | 1 refills | Status: DC

## 2017-04-01 NOTE — Patient Instructions (Addendum)
  Come back    IF you received an x-ray today, [facility] will [facility] an [facility] from [facility]. Please contact [facility] at [phone] with questions or concerns regarding your invoice.   IF you received labwork today, you will receive an invoice from [facility]. Please contact [facility] at [phone] with questions or concerns regarding your invoice.   Our billing staff will not be able to assist you with questions regarding bills from these companies.  You will be contacted with the lab results as soon as they are available. The fastest way to get your results is to activate your My Chart account. Instructions are located on the last page of this paperwork. If you have not heard from us regarding the results in 2 weeks, please contact this office.

## 2017-04-01 NOTE — Progress Notes (Signed)
    04/01/2017 10:33 AM   DOB: 12/05/1990 / MRN: [id number]  SUBJECTIVE:  Jennette Bankerndre Kaltenbach is a 27 y.o. male presenting for a lesion on his penis.  Tells me that [facility]. Was later screened for STI about 1 month after and there were negative.  He has one partner and children with this partner and they are currently in a monogamous relationship. He notes a mild itch before this lesion starting. He denies any pain today.   He has No Known Allergies.   He  has a past medical history of Seasonal allergies.    He  reports that  has never smoked. he has never used smokeless tobacco. He reports that he drinks alcohol. He reports that he uses drugs. Drug: Marijuana. Frequency: 2.00 times per week. He  reports that he currently engages in sexual activity and has had partners who are Male. The patient  has a past surgical history that includes thumb surgery and Leg Surgery (Right).  His family history is not on file.  Review of Systems  Constitutional: Negative for chills, diaphoresis and fever.  Gastrointestinal: Negative for nausea.  Skin: Negative for rash.  Neurological: Negative for dizziness.    The problem list and medications were reviewed and updated by myself where necessary and exist elsewhere in the encounter.   OBJECTIVE:  BP (!) 146/90 (BP Location: Right Arm, Patient Position: Sitting, Cuff Size: Normal)   Pulse 68   Temp 98.6 F (37 C) (Oral)   Resp 16   Ht 5' 6.5" (1.689 m)   Wt 205 lb 9.6 oz (93.3 kg)   SpO2 98%   BMI 32.69 kg/m   Physical Exam  Cardiovascular: Normal rate.  Pulmonary/Chest: Effort normal.  Genitourinary:       No results found for this or any previous visit (from the past 72 hour(s)).  No results found.  ASSESSMENT AND PLAN:  Debby Budndre was seen today for skin lesion.  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  He has been appropriately screened  since the prior infection and was negative at that time and is now in a monogamous and trusting relationship.  Advised we treat this symptomatically and if he notes increasing frequency of infection see him back and begin HSV prophylaxis.  -     valACYclovir (VALTREX) 1000 MG tablet; Take one tab daily for three days at the first sign of an outbreak  Need for prophylactic vaccination and inoculation against influenza -     Flu Vaccine QUAD 36+ mos IM    The patient is advised to call or return to clinic if he does not see an improvement in symptoms, or to seek the care of the closest emergency department if he worsens with the above plan.   Deliah BostonMichael Clark, MHS, PA-C [facility] 04/01/2017 10:33 AM

## 2017-04-12 ENCOUNTER — Encounter: Payer: BLUE CROSS/BLUE SHIELD | Admitting: Physician Assistant

## 2017-04-20 ENCOUNTER — Encounter: Payer: BLUE CROSS/BLUE SHIELD | Admitting: Physician Assistant

## 2017-04-24 ENCOUNTER — Encounter: Payer: BLUE CROSS/BLUE SHIELD | Admitting: Physician Assistant

## 2018-03-01 ENCOUNTER — Emergency Department (HOSPITAL_COMMUNITY)
Admission: EM | Admit: 2018-03-01 | Discharge: 2018-03-01 | Disposition: A | Discharge status: Home / Self Care | Payer: BLUE CROSS/BLUE SHIELD | Attending: Emergency Medicine | Admitting: Emergency Medicine

## 2018-03-01 ENCOUNTER — Other Ambulatory Visit: Payer: Self-pay

## 2018-03-01 ENCOUNTER — Encounter (HOSPITAL_COMMUNITY): Payer: Self-pay

## 2018-03-01 DIAGNOSIS — R1110 Vomiting, unspecified: Secondary | ICD-10-CM | POA: Diagnosis present

## 2018-03-01 DIAGNOSIS — R1111 Vomiting without nausea: Secondary | ICD-10-CM

## 2018-03-01 DIAGNOSIS — B349 Viral infection, unspecified: Secondary | ICD-10-CM | POA: Diagnosis not present

## 2018-03-01 LAB — CBC WITH DIFFERENTIAL/PLATELET
Abs Immature Granulocytes: 0.01 10*3/uL (ref 0.00–0.07)
Basophils Absolute: 0.1 10*3/uL (ref 0.0–0.1)
Basophils Relative: 1 %
Eosinophils Absolute: 0 10*3/uL (ref 0.0–0.5)
Eosinophils Relative: 0 %
HCT: 48.2 % (ref 39.0–52.0)
Hemoglobin: 14.4 g/dL (ref 13.0–17.0)
Immature Granulocytes: 0 %
Lymphocytes Relative: 31 %
Lymphs Abs: 3 10*3/uL (ref 0.7–4.0)
MCH: 22.1 pg — ABNORMAL LOW (ref 26.0–34.0)
MCHC: 29.9 g/dL — ABNORMAL LOW (ref 30.0–36.0)
MCV: 74 fL — ABNORMAL LOW (ref 80.0–100.0)
Monocytes Absolute: 1 10*3/uL (ref 0.1–1.0)
Monocytes Relative: 10 %
Neutro Abs: 5.6 10*3/uL (ref 1.7–7.7)
Neutrophils Relative %: 58 %
Platelets: 322 10*3/uL (ref 150–400)
RBC: 6.51 MIL/uL — ABNORMAL HIGH (ref 4.22–5.81)
RDW: 15.8 % — ABNORMAL HIGH (ref 11.5–15.5)
WBC: 9.6 10*3/uL (ref 4.0–10.5)
nRBC: 0 % (ref 0.0–0.2)

## 2018-03-01 LAB — COMPREHENSIVE METABOLIC PANEL
ALT: 17 U/L (ref 0–44)
AST: 18 U/L (ref 15–41)
Albumin: 4.2 g/dL (ref 3.5–5.0)
Alkaline Phosphatase: 55 U/L (ref 38–126)
Anion gap: 9 (ref 5–15)
BUN: 9 mg/dL (ref 6–20)
CO2: 24 mmol/L (ref 22–32)
Calcium: 9.3 mg/dL (ref 8.9–10.3)
Chloride: 105 mmol/L (ref 98–111)
Creatinine, Ser: 1.15 mg/dL (ref 0.61–1.24)
GFR calc Af Amer: >60 mL/min (ref >60)
GFR calc non Af Amer: >60 mL/min (ref >60)
Glucose, Bld: 105 mg/dL — ABNORMAL HIGH (ref 70–99)
Potassium: 3.7 mmol/L (ref 3.5–5.1)
Sodium: 138 mmol/L (ref 135–145)
Total Bilirubin: 1.2 mg/dL (ref 0.3–1.2)
Total Protein: 6.7 g/dL (ref 6.5–8.1)

## 2018-03-01 MED ORDER — ACETAMINOPHEN 325 MG PO TABS
650.0000 mg | ORAL_TABLET | Freq: Once | ORAL | Status: AC
  Administered 2018-03-01: 650 mg via ORAL
  Filled 2018-03-01: qty 2

## 2018-03-01 MED ORDER — ONDANSETRON 4 MG PO TBDP: 4.0000 mg | ORAL_TABLET | Freq: Three times a day (TID) | ORAL | 0 refills | Status: DC | PRN

## 2018-03-01 MED ORDER — ONDANSETRON 4 MG PO TBDP
8.0000 mg | ORAL_TABLET | Freq: Once | ORAL | Status: AC
  Administered 2018-03-01: 8 mg via ORAL
  Filled 2018-03-01: qty 2

## 2018-03-01 NOTE — ED Notes (Signed)
Patient verbalizes understanding of discharge instructions. Opportunity for questioning and answers were provided. Armband removed by staff, pt discharged from ED.  

## 2018-03-01 NOTE — Discharge Instructions (Addendum)
Home to rest, push hydrating fluids such as Gatorade. Take Zofran as needed as prescribed for vomiting. Bland diet as discussed.  Take Motrin or Tylenol as needed as directed for body aches. Check with primary care doctor in the next week, referral given if needed. Return to ER for new or worsening symptoms.

## 2018-03-01 NOTE — ED Triage Notes (Signed)
Pt presents to ED for NV and chills for several days. Pt states he is unable to keep anything down, states his daughter has a stomach bug a few days ago.

## 2018-03-01 NOTE — ED Provider Notes (Signed)
[facility] EMERGENCY DEPARTMENT Provider Note   CSN: [id number] Arrival date & time: 03/01/18  1447     History   Chief Complaint Chief Complaint  Patient presents with  . Fever  . Emesis    HPI Patrick Atkins is a 27 y.o. male.  27 year old male presents with complaint of vomiting, body aches, chills, mild headache. Patient states his symptoms started earlier this week with vomiting, thought he may have caught a stomach virus from his daughter who was home with vomiting and diarrhea the previous week. Onset chills, body aches, headache last night while at work.  Patient has tried taking ibuprofen for his symptoms however he is unable to keep this down.  Denies abdominal pain, changes in bowel or bladder habits, recent travel, insect bites, joint swelling or rash.  No other complaints or concerns.     Past Medical History:  Diagnosis Date  . Seasonal allergies     There are no active problems to display for this patient.   Past Surgical History:  Procedure Laterality Date  . LEG SURGERY Right   . thumb surgery          Home Medications    Prior to Admission medications   Medication Sig Start Date End Date Taking? Authorizing Provider  ibuprofen (ADVIL,MOTRIN) 800 MG tablet Take 1 tablet (800 mg total) by mouth 3 (three) times daily. 09/09/12   Sunnie Nielsenpitz, Brian, MD  ondansetron (ZOFRAN ODT) 4 MG disintegrating tablet Take 1 tablet (4 mg total) by mouth every 8 (eight) hours as needed for nausea or vomiting. 03/01/18   Jeannie FendMurphy, Laura A, PA-C  valACYclovir (VALTREX) 1000 MG tablet Take one tab daily for three days at the first sign of an outbreak 04/01/17   Ofilia Neaslark, Michael L, PA-C    Family History No family history on file.  Social History Social History   Tobacco Use  . Smoking status: Never Smoker  . Smokeless tobacco: Never Used  Substance Use Topics  . Alcohol use: Yes  . Drug use: Yes    Frequency: 2.0 times per week    Types: Marijuana      Allergies   Patient has no known allergies.   Review of Systems Review of Systems  Constitutional: Positive for chills.  HENT: Negative for congestion, rhinorrhea, sinus pressure, sinus pain, sneezing and sore throat.   Respiratory: Negative for cough.   Gastrointestinal: Positive for vomiting. Negative for abdominal pain, blood in stool, constipation, diarrhea and nausea.  Genitourinary: Negative for decreased urine volume, difficulty urinating, dysuria and urgency.  Musculoskeletal: Positive for arthralgias. Negative for gait problem and joint swelling.  Skin: Negative for rash and wound.  Allergic/Immunologic: Negative for immunocompromised state.  Neurological: Positive for headaches. Negative for dizziness and weakness.  Hematological: Negative for adenopathy.  Psychiatric/Behavioral: Negative for confusion.  All other systems reviewed and are negative.    Physical Exam Updated Vital Signs BP (!) 153/95 (BP Location: Right Arm)   Pulse 81   Temp 99.4 F (37.4 C) (Oral)   Resp 18   Ht 5' 6.5" (1.689 m)   Wt 90.7 kg   SpO2 97%   BMI 31.80 kg/m   Physical Exam  Constitutional: He is oriented to person, place, and time. He appears well-developed and well-nourished. No distress.  HENT:  Head: Normocephalic and atraumatic.  Right Ear: External ear normal.  Left Ear: External ear normal.  Nose: Nose normal.  Mouth/Throat: Oropharynx is clear and moist. No oropharyngeal exudate.  Eyes:  Conjunctivae are normal. Left eye exhibits no discharge.  Neck: Normal range of motion. Neck supple.  Cardiovascular: Normal rate, regular rhythm, normal heart sounds and intact distal pulses.  No murmur heard. Pulmonary/Chest: Effort normal and breath sounds normal. No respiratory distress.  Abdominal: Soft. He exhibits no distension. There is no tenderness.  Musculoskeletal: He exhibits no tenderness.  Lymphadenopathy:    He has no cervical adenopathy.  Neurological: He is  alert and oriented to person, place, and time. No sensory deficit.  Skin: Skin is warm and dry. No rash noted. He is not diaphoretic. No erythema.  Psychiatric: He has a normal mood and affect. His behavior is normal.  Nursing note and vitals reviewed.    ED Treatments / Results  Labs (all labs ordered are listed, but only abnormal results are displayed) Labs Reviewed  COMPREHENSIVE METABOLIC PANEL - Abnormal; Notable for the following components:      Result Value   Glucose, Bld 105 (*)    All other components within normal limits  CBC WITH DIFFERENTIAL/PLATELET - Abnormal; Notable for the following components:   RBC 6.51 (*)    MCV 74.0 (*)    MCH 22.1 (*)    MCHC 29.9 (*)    RDW 15.8 (*)    All other components within normal limits    EKG None  Radiology No results found.  Procedures Procedures (including critical care time)  Medications Ordered in ED Medications  ondansetron (ZOFRAN-ODT) disintegrating tablet 8 mg (8 mg Oral Given 03/01/18 1530)  acetaminophen (TYLENOL) tablet 650 mg (650 mg Oral Given 03/01/18 1531)     Initial Impression / Assessment and Plan / ED Course  I have reviewed the triage vital signs and the nursing notes.  Pertinent labs & imaging results that were available during my care of the patient were reviewed by me and considered in my medical decision making (see chart for details).  Clinical Course as of Mar 01 1654  Sat Mar 01, 2018  2070 27 year old otherwise healthy male with complaint of vomiting, body aches, chills.  Abdomen is soft and nontender, no changes in bowel habits, afebrile.  Patient was given Zofran, is tolerating p.o. fluids.  Lab work is without significant findings.  Patient discharged home with Zofran, bland diet instructions, recheck with PCP, return to ER for worsening symptoms.   [LM]    Clinical Course User Index [LM] Jeannie Fend, PA-C   Final Clinical Impressions(s) / ED Diagnoses   Final diagnoses:   Non-intractable vomiting without nausea, unspecified vomiting type  Viral illness    ED Discharge Orders         Ordered    ondansetron (ZOFRAN ODT) 4 MG disintegrating tablet  Every 8 hours PRN     03/01/18 1651           Jeannie Fend, PA-C 03/01/18 1655    Linwood Dibbles, MD 03/01/18 1906

## 2018-03-21 ENCOUNTER — Inpatient Hospital Stay: Payer: BLUE CROSS/BLUE SHIELD | Admitting: Critical Care Medicine

## 2018-03-31 ENCOUNTER — Ambulatory Visit: Payer: BLUE CROSS/BLUE SHIELD | Admitting: Family Medicine

## 2020-01-05 ENCOUNTER — Other Ambulatory Visit (HOSPITAL_BASED_OUTPATIENT_CLINIC_OR_DEPARTMENT_OTHER): Payer: Self-pay | Admitting: Internal Medicine

## 2020-01-05 ENCOUNTER — Ambulatory Visit: Payer: Self-pay | Attending: Internal Medicine

## 2020-01-05 DIAGNOSIS — Z23 Encounter for immunization: Secondary | ICD-10-CM

## 2020-01-13 MED FILL — PFIZER-BIONTECH COVID-19 VA: 30 | 1 days supply | Qty: 0 | Fill #0

## 2020-01-26 ENCOUNTER — Other Ambulatory Visit (HOSPITAL_BASED_OUTPATIENT_CLINIC_OR_DEPARTMENT_OTHER): Payer: Self-pay | Admitting: Internal Medicine

## 2020-01-27 MED FILL — FLUARIX QUADRIVALENT 0.5 ML: 0.5 | 1 days supply | Qty: 1 | Fill #0

## 2023-03-22 ENCOUNTER — Encounter: Payer: Self-pay | Admitting: Family

## 2023-03-22 ENCOUNTER — Ambulatory Visit: Payer: No Typology Code available for payment source | Admitting: Family

## 2023-03-22 VITALS — BP 141/105 | HR 65 | Temp 98.2°F | Resp 16 | Ht 66.0 in | Wt 230.0 lb

## 2023-03-22 DIAGNOSIS — B070 Plantar wart: Secondary | ICD-10-CM

## 2023-03-22 DIAGNOSIS — I10 Essential (primary) hypertension: Secondary | ICD-10-CM | POA: Insufficient documentation

## 2023-03-22 DIAGNOSIS — R739 Hyperglycemia, unspecified: Secondary | ICD-10-CM

## 2023-03-22 DIAGNOSIS — Z862 Personal history of diseases of the blood and blood-forming organs and certain disorders involving the immune mechanism: Secondary | ICD-10-CM

## 2023-03-22 DIAGNOSIS — R030 Elevated blood-pressure reading, without diagnosis of hypertension: Secondary | ICD-10-CM

## 2023-03-22 DIAGNOSIS — R079 Chest pain, unspecified: Secondary | ICD-10-CM | POA: Insufficient documentation

## 2023-03-22 DIAGNOSIS — R001 Bradycardia, unspecified: Secondary | ICD-10-CM

## 2023-03-22 LAB — IBC + FERRITIN
Ferritin: 93.6 ng/mL (ref 22.0–322.0)
Iron: 83 ug/dL (ref 42–165)
Saturation Ratios: 24.7 % (ref 20.0–50.0)
TIBC: 336 ug/dL (ref 250.0–450.0)
Transferrin: 240 mg/dL (ref 212.0–360.0)

## 2023-03-22 LAB — COMPREHENSIVE METABOLIC PANEL
ALT: 25 U/L (ref 0–53)
AST: 20 U/L (ref 0–37)
Albumin: 4.5 g/dL (ref 3.5–5.2)
Alkaline Phosphatase: 71 U/L (ref 39–117)
BUN: 12 mg/dL (ref 6–23)
CO2: 25 meq/L (ref 19–32)
Calcium: 9.3 mg/dL (ref 8.4–10.5)
Chloride: 108 meq/L (ref 96–112)
Creatinine, Ser: 0.93 mg/dL (ref 0.40–1.50)
GFR: 108.6 mL/min (ref >60.00)
Glucose, Bld: 90 mg/dL (ref 70–99)
Potassium: 4.5 meq/L (ref 3.5–5.1)
Sodium: 141 meq/L (ref 135–145)
Total Bilirubin: 0.6 mg/dL (ref 0.2–1.2)
Total Protein: 6.5 g/dL (ref 6.0–8.3)

## 2023-03-22 LAB — CBC
HCT: 48 % (ref 39.0–52.0)
Hemoglobin: 15.1 g/dL (ref 13.0–17.0)
MCHC: 31.4 g/dL (ref 30.0–36.0)
MCV: 72.9 fL — ABNORMAL LOW (ref 78.0–100.0)
Platelets: 355 10*3/uL (ref 150.0–400.0)
RBC: 6.59 Mil/uL — ABNORMAL HIGH (ref 4.22–5.81)
RDW: 15.3 % (ref 11.5–15.5)
WBC: 6.2 10*3/uL (ref 4.0–10.5)

## 2023-03-22 LAB — TSH: TSH: 0.32 u[IU]/mL — ABNORMAL LOW (ref 0.35–5.50)

## 2023-03-22 MED ORDER — AMLODIPINE BESYLATE 5 MG PO TABS: 5.0000 mg | ORAL_TABLET | Freq: Every day | ORAL | 3 refills | Status: DC

## 2023-03-22 MED ORDER — AMLODIPINE BENZOATE 1 MG/ML PO SUSP: 10.0000 mg | Freq: Every day | ORAL | 1 refills | Status: DC

## 2023-03-22 NOTE — Assessment & Plan Note (Signed)
EKG in office reassuring Sinus brady noted.  Baseline EKG

## 2023-03-22 NOTE — Assessment & Plan Note (Signed)
Cryotherapy performed in office.  3 bursts of 10 seconds applied Pt advised to:  Please monitor site for worsening signs/symptoms of infection to include: increasing redness, increasing tenderness, increase in size, and or pustulant drainage from site. If this is to occur please let me know immediately.  Apply aquaphor at night time

## 2023-03-22 NOTE — Progress Notes (Signed)
New Patient Office Visit  Subjective:  Patient ID: Patrick Atkins, male    DOB: 1990-09-30  Age: 32 y.o. MRN: [id number]  CC:  Chief Complaint  Patient presents with   Establish Care    HPI Patrick Atkins is here to establish care as a new patient.  Oriented to practice routines and expectations.  Prior provider was: [facility]    TDAP: unsure if he has had this, thinks might have had it in the last one year not 100% sure.   chronic concerns:  Elevated BP without dx of HTN: when they do check 140-150/90's  Today blood pressure 148/88. Did have chest pains christmas day, felt front into the back , came out of nowhere, lasted about ten minutes, was sharp and stabbing. He doesn't note he had anything fried/fatty/or spicy. Not since. Once time was watching tv and hands went numb all of a sudden, this occurred two months ago and not since.   Plantar wart vs callus on left foot, ongoing for about six years. Getting worse now uncomfortable.   ROS: Negative unless specifically indicated above in HPI.   Current Outpatient Medications:    amLODipine (NORVASC) 5 MG tablet, Take 1 tablet (5 mg total) by mouth daily., Disp: 90 tablet, Rfl: 3 Past Medical History:  Diagnosis Date   Seasonal allergies    Past Surgical History:  Procedure Laterality Date   LEG SURGERY Right    fracture 12/2007   thumb surgery     broke thumb 2006    Objective:   Today's Vitals: BP (!) 141/105   Pulse 65   Temp 98.2 F (36.8 C)   Resp 16   Ht 5\' 6"  (1.676 m)   Wt 230 lb (104.3 kg)   SpO2 98%   BMI 37.12 kg/m   Physical Exam Constitutional:      General: He is not in acute distress.    Appearance: Normal appearance. He is normal weight. He is not ill-appearing, toxic-appearing or diaphoretic.  Cardiovascular:     Rate and Rhythm: Normal rate and regular rhythm.  Pulmonary:     Effort: Pulmonary effort is normal.     Breath sounds: Normal breath sounds.  Musculoskeletal:        General: Normal  range of motion.  Skin:    Comments: Plantar wart left upper pad of foot with tenderness  Neurological:     General: No focal deficit present.     Mental Status: He is alert and oriented to person, place, and time. Mental status is at baseline.  Psychiatric:        Mood and Affect: Mood normal.        Behavior: Behavior normal.        Thought Content: Thought content normal.        Judgment: Judgment normal.      Assessment & Plan:  Elevated blood pressure reading without diagnosis of hypertension -     EKG 12-Lead  History of anemia -     CBC -     IBC + Ferritin; Future  Hyperglycemia -     Comprehensive metabolic panel  Primary hypertension Assessment & Plan: Confirmation of elevated bp x 3 with both in office and At home monitoring.  Pt advised of the following:  Continue medication as prescribed. Monitor blood pressure periodically and/or when you feel symptomatic. Goal is <130/90 on average. Ensure that you have rested for 30 minutes prior to checking your blood pressure. Record your readings and  bring them to your next visit if necessary.work on a low sodium diet. Start amlodipine 5 mg once daily  Baseline EKG today in office, with sinus bradycardia   Orders: -     TSH -     amLODIPine Besylate; Take 1 tablet (5 mg total) by mouth daily.  Dispense: 90 tablet; Refill: 3  Chest pain, unspecified type Assessment & Plan: EKG in office reassuring Sinus brady noted.  Baseline EKG   Orders: -     EKG 12-Lead  Plantar wart Assessment & Plan: Cryotherapy performed in office.  3 bursts of 10 seconds applied Pt advised to:  Please monitor site for worsening signs/symptoms of infection to include: increasing redness, increasing tenderness, increase in size, and or pustulant drainage from site. If this is to occur please let me know immediately.  Apply aquaphor at night time   Sinus bradycardia on ECG Assessment & Plan: Advised pt to monitor along with blood  pressure and when come back into office bring blood pressure log to review.     Follow-up: Return in about 2 weeks (around 04/05/2023) for f/u blood pressure.   Mort Sawyers, FNP

## 2023-03-22 NOTE — Assessment & Plan Note (Signed)
Confirmation of elevated bp x 3 with both in office and At home monitoring.  Pt advised of the following:  Continue medication as prescribed. Monitor blood pressure periodically and/or when you feel symptomatic. Goal is <130/90 on average. Ensure that you have rested for 30 minutes prior to checking your blood pressure. Record your readings and bring them to your next visit if necessary.work on a low sodium diet. Start amlodipine 5 mg once daily  Baseline EKG today in office, with sinus bradycardia

## 2023-03-22 NOTE — Assessment & Plan Note (Signed)
Advised pt to monitor along with blood pressure and when come back into office bring blood pressure log to review.

## 2023-03-25 ENCOUNTER — Other Ambulatory Visit: Payer: Self-pay | Admitting: Family

## 2023-03-25 DIAGNOSIS — R7989 Other specified abnormal findings of blood chemistry: Secondary | ICD-10-CM

## 2023-03-25 DIAGNOSIS — D751 Secondary polycythemia: Secondary | ICD-10-CM

## 2023-03-25 DIAGNOSIS — I10 Essential (primary) hypertension: Secondary | ICD-10-CM

## 2023-03-25 NOTE — Progress Notes (Signed)
Can we add free t3 and free t4 as well as tsi or does pt need to come in to draw?  Orders in

## 2023-04-01 ENCOUNTER — Telehealth: Payer: Self-pay | Admitting: Family

## 2023-04-01 NOTE — Telephone Encounter (Signed)
 Spoke with pt, please result note.

## 2023-04-01 NOTE — Telephone Encounter (Signed)
 Copied from CRM [id number]. Topic: General - Call Back - No Documentation >> Apr 01, 2023  1:20 PM Joanell B wrote: Reason for CRM: Pt is calling back in after receiving a call from [place] and is not sure what the call is about and would like to request a callback.

## 2023-04-03 ENCOUNTER — Other Ambulatory Visit (INDEPENDENT_AMBULATORY_CARE_PROVIDER_SITE_OTHER): Payer: No Typology Code available for payment source

## 2023-04-03 DIAGNOSIS — I10 Essential (primary) hypertension: Secondary | ICD-10-CM

## 2023-04-03 DIAGNOSIS — R7989 Other specified abnormal findings of blood chemistry: Secondary | ICD-10-CM | POA: Diagnosis not present

## 2023-04-03 DIAGNOSIS — D751 Secondary polycythemia: Secondary | ICD-10-CM

## 2023-04-03 LAB — T3, FREE: T3, Free: 5.3 pg/mL — ABNORMAL HIGH (ref 2.3–4.2)

## 2023-04-03 LAB — CBC
HCT: 51.2 % (ref 39.0–52.0)
Hemoglobin: 16.3 g/dL (ref 13.0–17.0)
MCHC: 31.9 g/dL (ref 30.0–36.0)
MCV: 71.6 fL — ABNORMAL LOW (ref 78.0–100.0)
Platelets: 475 10*3/uL — ABNORMAL HIGH (ref 150.0–400.0)
RBC: 7.16 Mil/uL — ABNORMAL HIGH (ref 4.22–5.81)
RDW: 14.8 % (ref 11.5–15.5)
WBC: 8.1 10*3/uL (ref 4.0–10.5)

## 2023-04-03 LAB — T4, FREE: Free T4: 0.9 ng/dL (ref 0.60–1.60)

## 2023-04-04 ENCOUNTER — Ambulatory Visit (INDEPENDENT_AMBULATORY_CARE_PROVIDER_SITE_OTHER): Payer: No Typology Code available for payment source | Admitting: Family

## 2023-04-04 VITALS — BP 110/62 | HR 76 | Temp 97.1°F | Ht 66.0 in | Wt 228.0 lb

## 2023-04-04 DIAGNOSIS — I10 Essential (primary) hypertension: Secondary | ICD-10-CM

## 2023-04-04 DIAGNOSIS — B070 Plantar wart: Secondary | ICD-10-CM | POA: Diagnosis not present

## 2023-04-04 DIAGNOSIS — E0590 Thyrotoxicosis, unspecified without thyrotoxic crisis or storm: Secondary | ICD-10-CM | POA: Diagnosis not present

## 2023-04-04 DIAGNOSIS — I159 Secondary hypertension, unspecified: Secondary | ICD-10-CM | POA: Insufficient documentation

## 2023-04-04 MED ORDER — AMLODIPINE BESYLATE 2.5 MG PO TABS: ORAL_TABLET | ORAL | 0 refills | Status: DC

## 2023-04-04 NOTE — Progress Notes (Signed)
 Low tsh, high t3. Suspect hyperthyroid. Referral placed for endo.  Platelets elevated suspect due to thyroid abn. D/w pt today in office visit.

## 2023-04-04 NOTE — Assessment & Plan Note (Signed)
 Increase amlodipine to 7.5 mg once daily  Suspect due to new dx hyperthyroid Will continue to monitor and pt to check blood pressure at home as well.

## 2023-04-04 NOTE — Progress Notes (Signed)
 Established Patient Office Visit  Subjective:      CC:  Chief Complaint  Patient presents with   Medical Management of Chronic Issues    Follow up on blood pressure     HPI: Patrick Atkins is a 33 y.o. male presenting on 04/04/2023 for Medical Management of Chronic Issues (Follow up on blood pressure ) . New finding of hyperthyroid, labs recently resulted with high T3 and also  Has noticed sweating for no reason, feeling easily agitated. Denies palpitations and or that heart is racing. Not losing a lot of weight, lost two pounds since last visit.   HTN: average since starting medication is now averaging 130/90 or so. Last night 120/90. One day was 130/80. Checks at night time.  Is sitting for 5-10 min prior to checking blood pressures.   Wt Readings from Last 3 Encounters:  04/04/23 228 lb (103.4 kg)  03/22/23 230 lb (104.3 kg)  03/01/18 200 lb (90.7 kg)        Social history:  Relevant past medical, surgical, family and social history reviewed and updated as indicated. Interim medical history since our last visit reviewed.  Allergies and medications reviewed and updated.  DATA REVIEWED: CHART IN EPIC     ROS: Negative unless specifically indicated above in HPI.    Current Outpatient Medications:    amLODipine  (NORVASC ) 2.5 MG tablet, Take one tablet along with one 5 mg tablet (total 7.5 mg once daily), Disp: 90 tablet, Rfl: 0   amLODipine  (NORVASC ) 5 MG tablet, Take 1 tablet (5 mg total) by mouth daily., Disp: 90 tablet, Rfl: 3      Objective:    BP 110/62   Pulse 76   Temp (!) 97.1 F (36.2 C) (Oral)   Ht 5' 6 (1.676 m)   Wt 228 lb (103.4 kg)   SpO2 97%   BMI 36.80 kg/m   Wt Readings from Last 3 Encounters:  04/04/23 228 lb (103.4 kg)  03/22/23 230 lb (104.3 kg)  03/01/18 200 lb (90.7 kg)    Physical Exam Constitutional:      General: He is not in acute distress.    Appearance: Normal appearance. He is normal weight. He is not ill-appearing,  toxic-appearing or diaphoretic.  Cardiovascular:     Rate and Rhythm: Normal rate and regular rhythm.  Pulmonary:     Effort: Pulmonary effort is normal.  Musculoskeletal:        General: Normal range of motion.  Neurological:     General: No focal deficit present.     Mental Status: He is alert and oriented to person, place, and time. Mental status is at baseline.  Psychiatric:        Mood and Affect: Mood normal.        Behavior: Behavior normal.        Thought Content: Thought content normal.        Judgment: Judgment normal.    Small left plantar wart top base of foot with mild surrounding tenderness       Assessment & Plan:  Hyperthyroidism -     Ambulatory referral to Endocrinology  Primary hypertension  Plantar wart Assessment & Plan: Pt gave verbal consent prior. Scalpel used to par wart prior to treatment with cryotherapy. Cryotherapy blast applied until white bead formation x 2 rounds. Advised pt to apply Aquaphor otherwise no other treatment needed. Educated will likely start to resolve within 4-6 days, may blister and or slough off however monitor for s/s  infection. Pt tolerated procedure well  May need repeat rounds of cryotherapy pt knows to schedule f/u if repeat session needed.  Handout given to pt as well.     Secondary hypertension Assessment & Plan: Increase amlodipine  to 7.5 mg once daily  Suspect due to new dx hyperthyroid Will continue to monitor and pt to check blood pressure at home as well.   Orders: -     Ambulatory referral to Endocrinology -     amLODIPine  Besylate; Take one tablet along with one 5 mg tablet (total 7.5 mg once daily)  Dispense: 90 tablet; Refill: 0     Return in about 3 months (around 07/03/2023) for f/u blood pressure.  Ginger Patrick, MSN, APRN, FNP-C [facility]

## 2023-04-04 NOTE — Patient Instructions (Signed)
  Start taking 5 mg amlodipine along with 2.5 mg for a total of 7.5 mg .   Just let me know if insurance won't cover and I will have you take 1 and 1/2 tablet.

## 2023-04-04 NOTE — Assessment & Plan Note (Signed)
  Pt gave verbal consent prior. Scalpel used to par wart prior to treatment with cryotherapy. Cryotherapy blast applied until white bead formation x 2 rounds. Advised pt to apply Aquaphor otherwise no other treatment needed. Educated will likely start to resolve within 4-6 days, may blister and or slough off however monitor for s/s infection. Pt tolerated procedure well  May need repeat rounds of cryotherapy pt knows to schedule f/u if repeat session needed.  Handout given to pt as well.

## 2023-04-08 LAB — THYROID STIMULATING IMMUNOGLOBULIN: TSI: <89 % baseline (ref <140)

## 2023-04-08 LAB — ERYTHROPOIETIN: Erythropoietin: 4.8 m[IU]/mL (ref 2.6–18.5)

## 2023-04-11 LAB — JAK2 GENOTYPR

## 2023-05-16 NOTE — Progress Notes (Addendum)
 Patient ID: Patrick Atkins, male   DOB: 12-Jan-1991, 33 y.o.   MRN: [id number]  HPI  Patrick Atkins is a 33 y.o.-year-old male, referred by his PCP, Mort Sawyers, FNP, for evaluation and management of thyrotoxicosis. He is here with his wife who offers part of the history especially related to his symptoms and past medical history.  Patient was found to have a decreased TSH along with an increased free T3 and normal free T4 1.5 months ago.  At that time, he presented to establish care with his primary care doctor but had HTN and increased perspiration. No consistent palpitations (only when drinking energy drinks), no tremors or weight loss.  At today's visit, he feels well, but with persistent heat intolerance.  I reviewed pt's thyroid tests: Lab Results  Component Value Date   TSH 0.32 (L) 03/22/2023   FREET4 0.90 04/03/2023   T3FREE 5.3 (H) 04/03/2023   Antithyroid antibodies: Lab Results  Component Value Date   TSI <89 04/03/2023   Recent white blood cell count was normal: Lab Results  Component Value Date   WBC 8.1 04/03/2023   HGB 16.3 04/03/2023   HCT 51.2 04/03/2023   MCV 71.6 (L) 04/03/2023   PLT 475.0 (H) 04/03/2023   Pt denies: - feeling nodules in neck - hoarseness - dysphagia - choking - SOB with lying down  Pt does have a FH of thyroid ds.: M and older sister: hypothyroidism. No FH of thyroid cancer. No h/o radiation tx to head or neck. No seaweed or kelp, no recent contrast studies. No steroid use. No herbal supplements. No Biotin use.  No amiodarone use.  Pt. also has a history of anemia, hyperglycemia. Lab Results  Component Value Date   GLUCOSE 90 03/22/2023   GLUCOSE 105 (H) 03/01/2018   GLUCOSE 120 (H) 01/16/2008   No results found for: "HGBA1C" He had several fractures in high school playing football.  He smokes MJ.  ROS: Constitutional: + see HPI Eyes: no blurry vision, no xerophthalmia ENT: no sore throat, + see HPI Cardiovascular: no  CP/SOB/palpitations/leg swelling Respiratory: no cough/SOB Gastrointestinal: no N/V/D/C Musculoskeletal: no muscle/+ joint aches (knees  -from playing sports) Skin: no rashes Neurological: no tremors/numbness/tingling/dizziness Psychiatric: no depression/anxiety  Past Medical History:  Diagnosis Date   Seasonal allergies    Past Surgical History:  Procedure Laterality Date   LEG SURGERY Right    fracture 12/2007   thumb surgery     broke thumb 2006   Social History   Socioeconomic History   Marital status: Married    Spouse name: Not on file   Number of children: 1   Years of education: Not on file   Highest education level: Not on file  Occupational History    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[employer] , janitorial  Tobacco Use   Smoking status: Some Days    Types: Cigarettes, Cigars   Smokeless tobacco: Never   Tobacco comments:    About 2 black and milds a week  Vaping Use   Vaping status: Never Used  Substance and Sexual Activity   Alcohol use: Yes    Comment: liquor 1 drink 2x a week   Drug use: Yes    Frequency: 5.0 times per week    Types: Marijuana   Sexual activity: Yes    Partners: Female    Birth control/protection: OCP  Other Topics Concern   Not on file  Social History Narrative   Six year old  daughter (2024)   Social Drivers of Corporate investment banker Strain: Not on file  Food Insecurity: Not on file  Transportation Needs: Not on file  Physical Activity: Not on file  Stress: Not on file  Social Connections: Not on file  Intimate Partner Violence: Not on file   Current Outpatient Medications on File Prior to Visit  Medication Sig Dispense Refill   amLODipine (NORVASC) 2.5 MG tablet Take one tablet along with one 5 mg tablet (total 7.5 mg once daily) 90 tablet 0   amLODipine (NORVASC) 5 MG tablet Take 1 tablet (5 mg total) by mouth daily. 90 tablet 3   No current facility-administered medications on file prior to  visit.   No Known Allergies Family History  Problem Relation Age of Onset   Hypertension Mother    Breast cancer Mother    Hypertension Sister    Hypertension Maternal Grandmother    PE: BP 124/76   Pulse 73   Ht 5' 6.5" (1.689 m)   Wt 229 lb 3.2 oz (104 kg)   SpO2 96%   BMI 36.44 kg/m  Wt Readings from Last 3 Encounters:  05/17/23 229 lb 3.2 oz (104 kg)  04/04/23 228 lb (103.4 kg)  03/22/23 230 lb (104.3 kg)   Constitutional: overweight, in NAD Eyes: EOMI, no exophthalmos, no lid lag, no stare ENT: no thyromegaly, no thyroid bruits, no cervical lymphadenopathy Cardiovascular: RRR, No MRG Respiratory: CTA B Musculoskeletal: no deformities, + normal patellar DTR  B Skin: no rashes Neurological: no tremor with outstretched hands  ASSESSMENT: 1. Thyrotoxicosis  PLAN:  1. Patient with a recently found mildly low TSH, at 0.32 (0.35-5.5), with thyrotoxic sxs and symptoms: heat intolerance and perspiration, increase blood pressure, but without tremors, hyperdefecation, anxiety.  He describes palpitations when he drinks energy drinks. - he does not appear to have exogenous causes for the low TSH.  - We discussed that possible causes of thyrotoxicosis are:  Graves ds - his TSI antibodies were checked and they were undetectable, but we discussed that sometimes mild Graves' disease may present without elevated antibodies; however, we will recheck this today Thyroiditis - will check an ESR toxic multinodular goiter/ toxic adenoma - I cannot feel nodules at palpation of his thyroid - will recheck the TSH, fT3 and fT4 and also add thyroid stimulating antibodies to screen for Graves' disease and also an ESR to screen for thyroiditis - If the tests remain abnormal, we may need an uptake and scan to differentiate between the 3 above possible etiologies  - we discussed about possible consequences of uncontrolled hyperthyroidism including tachycardia, arrhythmia, hypercoagulability with  increased risk of stroke and heart attacks.  For now, advised him to avoid strenuous exercise and also to stop energy drinks. - we discussed about possible modalities of treatment for the above conditions, to include methimazole use, radioactive iodine ablation or (last resort) surgery.  We discussed about the possible side effects of methimazole and the rest of the treatments. - we may need to do thyroid ultrasound depending on the results of the uptake and scan (if a cold nodule is present) - I do not feel that we need to add beta blockers at this time, since he is not tachycardic or tremulous - no signs of Graves' ophthalmopathy: he does not have any double vision, blurry vision, eye pain, chemosis. - RTC in 3 months, but likely sooner for repeat labs  Orders Placed This Encounter  Procedures   TSH   T4,  free   T3, free   Thyroid stimulating immunoglobulin   Sedimentation Rate   Component     Latest Ref Rng 05/17/2023  TSH     0.40 - 4.50 mIU/L 0.27 (L)   Triiodothyronine,Free,Serum     2.3 - 4.2 pg/mL 4.2   T4,Free(Direct)     0.8 - 1.8 ng/dL 1.2   TSI     <914 % baseline <89   Sed Rate     0 - 15 mm/h 2   Patient's TSH is slightly lower, but the free thyroid hormones, Graves' antibodies, and ESR are all normal.  For now, especially as he is asymptomatic, I would suggest to repeat the TFTs in 1-1/2 months, but I would not necessarily suggest intervention.  Carlus Pavlov, MD PhD [facility]

## 2023-05-17 ENCOUNTER — Ambulatory Visit: Payer: No Typology Code available for payment source | Admitting: Internal Medicine

## 2023-05-17 ENCOUNTER — Encounter: Payer: Self-pay | Admitting: Internal Medicine

## 2023-05-17 VITALS — BP 124/76 | HR 73 | Ht 66.5 in | Wt 229.2 lb

## 2023-05-17 DIAGNOSIS — E0580 Other thyrotoxicosis without thyrotoxic crisis or storm: Secondary | ICD-10-CM | POA: Diagnosis not present

## 2023-05-17 NOTE — Patient Instructions (Addendum)
 Please stop at the lab.  Please return to see me in 3 months, but possibly sooner for labs.  Hyperthyroidism Hyperthyroidism refers to a thyroid gland that is too active or overactive. The thyroid gland is a small gland located in the lower front part of the neck, just in front of the windpipe (trachea). This gland makes hormones that: Help control how the body uses food for energy (metabolism). Help the heart and brain work well. Keep your bones strong. When the thyroid is overactive, it produces too much of a hormone called thyroxine. What are the causes? This condition may be caused by: Graves' disease. This is a disorder in which the body's disease-fighting system (immune system) attacks the thyroid gland. This is the most common cause. Inflammation of the thyroid gland. A tumor in the thyroid gland. Use of certain medicines, including: Prescription thyroid hormone replacement. Herbal supplements that mimic thyroid hormones. Amiodarone therapy. Solid or fluid-filled lumps within your thyroid gland (thyroid nodules). Taking in a large amount of iodine from foods or medicines. What increases the risk? You are more likely to develop this condition if: You are male. You have a family history of thyroid conditions. You smoke tobacco. You use a medicine called lithium. You take medicines that affect the immune system (immunosuppressants). What are the signs or symptoms? Symptoms of this condition include: Nervousness. Inability to tolerate heat. Diarrhea. Rapid heart rate. Shaky hands. Restlessness. Sleep problems. Other symptoms may include: Heart skipping beats or making extra beats. Unexplained weight loss. Change in the texture of hair or skin. Loss of menstruation. Fatigue. Enlarged thyroid gland or a lump in the thyroid (nodule). You may also have symptoms of Graves' disease, which may include: Protruding eyes. Dry eyes. Red or swollen eyes. Problems with  vision. How is this diagnosed? This condition may be diagnosed based on: Your symptoms and medical history. A physical exam. Blood tests. Thyroid ultrasound. This test involves using sound waves to produce images of the thyroid gland. A thyroid scan. A radioactive substance is injected into a vein, and images show how much iodine is present in the thyroid. Radioactive iodine uptake test (RAIU). A small amount of radioactive iodine is given by mouth to see how much iodine the thyroid absorbs after a certain amount of time. How is this treated? Treatment depends on the cause and severity of the condition. Treatment may include: Medicines to reduce the amount of thyroid hormone your body makes. Medicines to help manage your symptoms. Radioactive iodine treatment (radioiodine therapy). This involves swallowing a small dose of radioactive iodine, in capsule or liquid form, to kill thyroid cells. Surgery to remove part or all of your thyroid gland. You may need to take thyroid hormone replacement medicine for the rest of your life after thyroid surgery. Follow these instructions at home:  Take over-the-counter and prescription medicines only as told by your health care provider. Do not use any products that contain nicotine or tobacco. These products include cigarettes, chewing tobacco, and vaping devices, such as e-cigarettes. If you need help quitting, ask your health care provider. Follow any instructions from your health care provider about diet. You may be instructed to limit foods that contain iodine. Keep all follow-up visits. You will need to have blood tests regularly so that your health care provider can monitor your condition. Where to find more information [employer] of Diabetes and Digestive and Kidney Diseases: [email or web address] Contact a health care provider if: Your symptoms do not get better with treatment. You  have a fever. You have abdominal pain. You feel dizzy. You are  taking thyroid hormone replacement medicine and: You have symptoms of depression. You feel like you are tired all the time. You gain weight. Get help right away if: You have sudden, unexplained confusion or other mental changes. You have chest pain. You have fast or irregular heartbeats (palpitations). You have difficulty breathing. These symptoms may be an emergency. Get help right away. Call 911. Do not wait to see if the symptoms will go away. Do not drive yourself to the hospital. Summary The thyroid gland is a small gland located in the lower front part of the neck, just in front of the windpipe. Hyperthyroidism is when the thyroid gland is too active and produces too much of a hormone called thyroxine. The most common cause is Graves' disease, a disorder in which your immune system attacks the thyroid gland. Hyperthyroidism can cause various symptoms, such as unexplained weight loss, nervousness, inability to tolerate heat, or changes in your heartbeat. Treatment may include medicine to reduce the amount of thyroid hormone your body makes, radioiodine therapy, surgery, or medicines to manage symptoms. This information is not intended to replace advice given to you by your health care provider. Make sure you discuss any questions you have with your health care provider. Document Revised: 05/05/2021 Document Reviewed: 05/05/2021 Elsevier Patient Education  2024 [employer].

## 2023-05-20 ENCOUNTER — Other Ambulatory Visit: Payer: No Typology Code available for payment source

## 2023-05-21 ENCOUNTER — Encounter: Payer: Self-pay | Admitting: Internal Medicine

## 2023-05-21 LAB — TSH: TSH: 0.27 m[IU]/L — ABNORMAL LOW (ref 0.40–4.50)

## 2023-05-21 LAB — SEDIMENTATION RATE: Sed Rate: 2 mm/h (ref 0–15)

## 2023-05-21 LAB — T3, FREE: T3, Free: 4.2 pg/mL (ref 2.3–4.2)

## 2023-05-21 LAB — THYROID STIMULATING IMMUNOGLOBULIN: TSI: <89 %{baseline} (ref <140)

## 2023-05-21 LAB — T4, FREE: Free T4: 1.2 ng/dL (ref 0.8–1.8)

## 2023-05-21 NOTE — Addendum Note (Signed)
 Addended by: Carlus Pavlov on: 05/21/2023 04:36 PM   Modules accepted: Orders

## 2023-05-23 ENCOUNTER — Ambulatory Visit: Payer: No Typology Code available for payment source | Admitting: "Endocrinology

## 2023-06-30 ENCOUNTER — Other Ambulatory Visit: Payer: Self-pay | Admitting: Family

## 2023-06-30 DIAGNOSIS — I159 Secondary hypertension, unspecified: Secondary | ICD-10-CM

## 2023-07-03 ENCOUNTER — Encounter: Payer: Self-pay | Admitting: Family

## 2023-07-03 ENCOUNTER — Ambulatory Visit (INDEPENDENT_AMBULATORY_CARE_PROVIDER_SITE_OTHER): Payer: No Typology Code available for payment source | Admitting: Family

## 2023-07-03 VITALS — BP 128/86 | HR 68 | Temp 98.0°F | Ht 66.0 in | Wt 230.8 lb

## 2023-07-03 DIAGNOSIS — I159 Secondary hypertension, unspecified: Secondary | ICD-10-CM

## 2023-07-03 DIAGNOSIS — I10 Essential (primary) hypertension: Secondary | ICD-10-CM | POA: Diagnosis not present

## 2023-07-03 DIAGNOSIS — Z13220 Encounter for screening for lipoid disorders: Secondary | ICD-10-CM

## 2023-07-03 DIAGNOSIS — H11122 Conjunctival concretions, left eye: Secondary | ICD-10-CM | POA: Insufficient documentation

## 2023-07-03 LAB — LIPID PANEL
Cholesterol: 229 mg/dL — ABNORMAL HIGH (ref 0–200)
HDL: 41.8 mg/dL (ref >39.00)
LDL Cholesterol: 171 mg/dL — ABNORMAL HIGH (ref 0–99)
NonHDL: 187.49
Total CHOL/HDL Ratio: 5
Triglycerides: 80 mg/dL (ref 0.0–149.0)
VLDL: 16 mg/dL (ref 0.0–40.0)

## 2023-07-03 LAB — MICROALBUMIN / CREATININE URINE RATIO
Creatinine,U: 142 mg/dL
Microalb Creat Ratio: UNDETERMINED mg/g (ref 0.0–30.0)
Microalb, Ur: <0.7 mg/dL

## 2023-07-03 LAB — BASIC METABOLIC PANEL WITH GFR
BUN: 11 mg/dL (ref 6–23)
CO2: 25 meq/L (ref 19–32)
Calcium: 10 mg/dL (ref 8.4–10.5)
Chloride: 105 meq/L (ref 96–112)
Creatinine, Ser: 1.02 mg/dL (ref 0.40–1.50)
GFR: 97.01 mL/min (ref >60.00)
Glucose, Bld: 88 mg/dL (ref 70–99)
Potassium: 4.6 meq/L (ref 3.5–5.1)
Sodium: 140 meq/L (ref 135–145)

## 2023-07-03 MED ORDER — AMLODIPINE BESYLATE 5 MG PO TABS: 5.0000 mg | ORAL_TABLET | Freq: Every day | ORAL | 3 refills | Status: AC

## 2023-07-03 MED ORDER — OFLOXACIN 0.3 % OP SOLN: 1.0000 [drp] | Freq: Four times a day (QID) | OPHTHALMIC | 0 refills | Status: AC

## 2023-07-03 MED ORDER — AMLODIPINE BESYLATE 2.5 MG PO TABS: ORAL_TABLET | ORAL | 3 refills | Status: AC

## 2023-07-03 NOTE — Assessment & Plan Note (Signed)
 Stable in office today Did advise patient to check his blood pressure daily at home and report back in the next few weeks so that we can confirm that at home blood pressure is stable as well.  Continue amlodipine 7.5 mg once daily, urine microalbumin ordered for today

## 2023-07-03 NOTE — Progress Notes (Signed)
 Established Patient Office Visit  Subjective:   Patient ID: Patrick Atkins, male    DOB: [birth year]  Age: 33 y.o. MRN: [id number]  CC:  Chief Complaint  Patient presents with   Medical Management of Chronic Issues    HPI: Patrick Atkins is a 33 y.o. male presenting on 07/03/2023 for Medical Management of Chronic Issues  Blood pressure, doesn't check at home. Today looks good in office. Tolerating medication well. Taking 7.5 mg amlodipine.   Abn TSH established with Dr. Lafe Garin, endocrinology   Was drinking an energy drink a day, and quit drinking them about one month ago. Thinks may have been contributing to his blood pressure.   Left upper eyelid with redness and slight inflammation, started yesterday. With allergies right now taking otc claritin.     ROS: Negative unless specifically indicated above in HPI.   Relevant past medical history reviewed and updated as indicated.   Allergies and medications reviewed and updated.   Current Outpatient Medications:    ofloxacin (OCUFLOX) 0.3 % ophthalmic solution, Place 1 drop into the left eye 4 (four) times daily for 7 days., Disp: 1.4 mL, Rfl: 0   amLODipine (NORVASC) 2.5 MG tablet, Take one tablet along with one 5 mg tablet (total 7.5 mg once daily), Disp: 90 tablet, Rfl: 3   amLODipine (NORVASC) 5 MG tablet, Take 1 tablet (5 mg total) by mouth daily., Disp: 90 tablet, Rfl: 3  No Known Allergies  Objective:   BP 128/86 (BP Location: Left Arm, Patient Position: Sitting, Cuff Size: Large)   Pulse 68   Temp 98 F (36.7 C) (Temporal)   Ht 5\' 6"  (1.676 m)   Wt 230 lb 12.8 oz (104.7 kg)   SpO2 98%   BMI 37.25 kg/m    Physical Exam Constitutional:      General: He is not in acute distress.    Appearance: Normal appearance. He is normal weight. He is not ill-appearing, toxic-appearing or diaphoretic.  Cardiovascular:     Rate and Rhythm: Normal rate.  Pulmonary:     Effort: Pulmonary effort is normal.  Musculoskeletal:         General: Normal range of motion.  Neurological:     General: No focal deficit present.     Mental Status: He is alert and oriented to person, place, and time. Mental status is at baseline.  Psychiatric:        Mood and Affect: Mood normal.        Behavior: Behavior normal.        Thought Content: Thought content normal.        Judgment: Judgment normal.     Assessment & Plan:  Screening for lipoid disorders -     Lipid panel  Secondary hypertension Assessment & Plan: Stable in office today Did advise patient to check his blood pressure daily at home and report back in the next few weeks so that we can confirm that at home blood pressure is stable as well.  Continue amlodipine 7.5 mg once daily, urine microalbumin ordered for today  Orders: -     amLODIPine Besylate; Take one tablet along with one 5 mg tablet (total 7.5 mg once daily)  Dispense: 90 tablet; Refill: 3 -     amLODIPine Besylate; Take 1 tablet (5 mg total) by mouth daily.  Dispense: 90 tablet; Refill: 3 -     Basic metabolic panel with GFR -     Microalbumin / creatinine urine ratio  Primary hypertension  Conjunctival concretion of upper eyelid of left eye Assessment & Plan: rx for ofloxacin sent to pt pharmacy, take as prescribed.  Discussed how to wash eye appropriately with warm warm cloth from inner to outer canthus. Wash bed sheets and pillow cases to prevent re-infection. Frequent handwashing. If no improvement in 24-48 hours with eye drops as prescribed, please call office.    Orders: -     Ofloxacin; Place 1 drop into the left eye 4 (four) times daily for 7 days.  Dispense: 1.4 mL; Refill: 0     Follow up plan: Return in about 6 months (around 01/02/2024) for f/u CPE.  Mort Sawyers, FNP

## 2023-07-03 NOTE — Assessment & Plan Note (Signed)
rx for ofloxacin sent to pt pharmacy, take as prescribed.  Discussed how to wash eye appropriately with warm warm cloth from inner to outer canthus. Wash bed sheets and pillow cases to prevent re-infection. Frequent handwashing. If no improvement in 24-48 hours with eye drops as prescribed, please call office.   

## 2023-07-04 ENCOUNTER — Encounter: Payer: Self-pay | Admitting: Family

## 2023-08-29 ENCOUNTER — Ambulatory Visit: Payer: No Typology Code available for payment source | Admitting: Internal Medicine

## 2023-10-04 ENCOUNTER — Ambulatory Visit: Admitting: Family

## 2023-10-04 VITALS — BP 122/76 | HR 59 | Temp 98.1°F | Ht 66.0 in | Wt 229.0 lb

## 2023-10-04 DIAGNOSIS — E0590 Thyrotoxicosis, unspecified without thyrotoxic crisis or storm: Secondary | ICD-10-CM | POA: Insufficient documentation

## 2023-10-04 DIAGNOSIS — R739 Hyperglycemia, unspecified: Secondary | ICD-10-CM | POA: Diagnosis not present

## 2023-10-04 DIAGNOSIS — E782 Mixed hyperlipidemia: Secondary | ICD-10-CM | POA: Insufficient documentation

## 2023-10-04 DIAGNOSIS — I159 Secondary hypertension, unspecified: Secondary | ICD-10-CM | POA: Diagnosis not present

## 2023-10-04 LAB — T4, FREE: Free T4: 0.9 ng/dL (ref 0.60–1.60)

## 2023-10-04 LAB — LIPID PANEL
Cholesterol: 274 mg/dL — ABNORMAL HIGH (ref 0–200)
HDL: 42.4 mg/dL (ref >39.00)
LDL Cholesterol: 215 mg/dL — ABNORMAL HIGH (ref 0–99)
NonHDL: 231.81
Total CHOL/HDL Ratio: 6
Triglycerides: 83 mg/dL (ref 0.0–149.0)
VLDL: 16.6 mg/dL (ref 0.0–40.0)

## 2023-10-04 LAB — TSH: TSH: 0.28 u[IU]/mL — ABNORMAL LOW (ref 0.35–5.50)

## 2023-10-04 LAB — HEMOGLOBIN A1C: Hgb A1c MFr Bld: 5.9 % (ref 4.6–6.5)

## 2023-10-04 LAB — T3, FREE: T3, Free: 3.9 pg/mL (ref 2.3–4.2)

## 2023-10-04 NOTE — Progress Notes (Signed)
 Established Patient Office Visit  Subjective:      CC:  Chief Complaint  Patient presents with   Medical Management of Chronic Issues    HPI: Patrick Atkins is a 33 y.o. male presenting on 10/04/2023 for Medical Management of Chronic Issues  Diet: cutting sweet tea and soda down slowly, has quit drinking energy drinks.   Endo, Dr. Vianne, has a f/u coming up next month for stability.   HTN: blood pressure in office today good. Doing well on amloidpine 7.5 mg once daily.   HLD:  Lab Results  Component Value Date   CHOL 229 (H) 07/03/2023   HDL 41.80 07/03/2023   LDLCALC 171 (H) 07/03/2023   TRIG 80.0 07/03/2023   CHOLHDL 5 07/03/2023           Social history:  Relevant past medical, surgical, family and social history reviewed and updated as indicated. Interim medical history since our last visit reviewed.  Allergies and medications reviewed and updated.  DATA REVIEWED: CHART IN EPIC     ROS: Negative unless specifically indicated above in HPI.    Current Outpatient Medications:    amLODipine  (NORVASC ) 2.5 MG tablet, Take one tablet along with one 5 mg tablet (total 7.5 mg once daily), Disp: 90 tablet, Rfl: 3   amLODipine  (NORVASC ) 5 MG tablet, Take 1 tablet (5 mg total) by mouth daily., Disp: 90 tablet, Rfl: 3      Objective:    BP 122/76   Pulse (!) 59   Temp 98.1 F (36.7 C) (Oral)   Ht 5' 6 (1.676 m)   Wt 229 lb (103.9 kg)   SpO2 96%   BMI 36.96 kg/m   Wt Readings from Last 3 Encounters:  10/04/23 229 lb (103.9 kg)  07/03/23 230 lb 12.8 oz (104.7 kg)  05/17/23 229 lb 3.2 oz (104 kg)    Physical Exam Vitals reviewed.  Constitutional:      General: He is not in acute distress.    Appearance: Normal appearance. He is normal weight. He is not ill-appearing, toxic-appearing or diaphoretic.  Neck:     Thyroid : No thyroid  tenderness.  Cardiovascular:     Rate and Rhythm: Normal rate and regular rhythm.  Pulmonary:     Effort:  Pulmonary effort is normal.  Musculoskeletal:        General: Normal range of motion.  Neurological:     General: No focal deficit present.     Mental Status: He is alert and oriented to person, place, and time. Mental status is at baseline.  Psychiatric:        Mood and Affect: Mood normal.        Behavior: Behavior normal.        Thought Content: Thought content normal.        Judgment: Judgment normal.           Assessment & Plan:  Hyperglycemia -     Hemoglobin A1c  Secondary hypertension Assessment & Plan: Continue amlodipine  7.5 mg once daily.  Stable, doing well with improvement    Hyperthyroidism Assessment & Plan: Pt has f/u in six days with endo  Will obtain lab work for his endo and will forward along to her for his visit since we are drawing labs today.   Orders: -     TSH -     T4, free -     Thyroid  stimulating immunoglobulin -     T3, free  Mixed hyperlipidemia Assessment & Plan:  Ordered lipid panel, pending results. Work on low cholesterol diet and exercise as tolerated   Orders: -     Lipid panel     Return in about 6 months (around 04/05/2024) for f/u CPE.  Ginger Patrick, MSN, APRN, FNP-C [facility]

## 2023-10-04 NOTE — Assessment & Plan Note (Signed)
 Ordered lipid panel, pending results. Work on low cholesterol diet and exercise as tolerated

## 2023-10-04 NOTE — Assessment & Plan Note (Signed)
 Continue amlodipine  7.5 mg once daily.  Stable, doing well with improvement

## 2023-10-04 NOTE — Assessment & Plan Note (Signed)
 Pt has f/u in six days with endo  Will obtain lab work for his endo and will forward along to her for his visit since we are drawing labs today.

## 2023-10-07 ENCOUNTER — Ambulatory Visit: Payer: Self-pay | Admitting: Family

## 2023-10-07 DIAGNOSIS — E782 Mixed hyperlipidemia: Secondary | ICD-10-CM

## 2023-10-08 LAB — THYROID STIMULATING IMMUNOGLOBULIN: TSI: 101 %{baseline} (ref <140)

## 2023-10-09 ENCOUNTER — Other Ambulatory Visit: Payer: Self-pay | Admitting: Family

## 2023-10-09 DIAGNOSIS — E782 Mixed hyperlipidemia: Secondary | ICD-10-CM

## 2023-10-10 ENCOUNTER — Ambulatory Visit (INDEPENDENT_AMBULATORY_CARE_PROVIDER_SITE_OTHER): Admitting: Internal Medicine

## 2023-10-10 ENCOUNTER — Encounter: Payer: Self-pay | Admitting: Internal Medicine

## 2023-10-10 VITALS — BP 136/80 | HR 96 | Ht 66.0 in | Wt 222.0 lb

## 2023-10-10 DIAGNOSIS — E0580 Other thyrotoxicosis without thyrotoxic crisis or storm: Secondary | ICD-10-CM

## 2023-10-10 NOTE — Patient Instructions (Addendum)
 We will schedule a thyroid  uptake and scan.  Please return to see me in December.

## 2023-10-10 NOTE — Progress Notes (Signed)
 Patient ID: Patrick Atkins, male   DOB: 09/12/1990, 33 y.o.   MRN: [id number]  HPI  Patrick Atkins is a 33 y.o.-year-old male, initially referred by his PCP, Patrick Antu, FNP, returning for follow-up for thyrotoxicosis.  Last visit 5 months ago.  Interim history: He denies tremors, palpitations, heat intolerance, unintentional weight loss, anxiety. He cut out energy drinks since last OV.  He is feeling better.  His heat intolerance resolved. He is preparing to go see a nutritionist due to the high cholesterol.  Reviewed history: Patient was found to have a decreased TSH along with an increased free T3 and normal free T4 in 02/2023.  At that time, he presented to establish care with his primary care doctor but had HTN and increased perspiration. No consistent palpitations (only when drinking energy drinks), no tremors or weight loss.  At our initial visit, he was feeling well, but with persistent heat intolerance.  I reviewed pt's thyroid  tests: Lab Results  Component Value Date   TSH 0.28 (L) 10/04/2023   TSH 0.27 (L) 05/17/2023   TSH 0.32 (L) 03/22/2023   FREET4 0.90 10/04/2023   FREET4 1.2 05/17/2023   FREET4 0.90 04/03/2023   T3FREE 3.9 10/04/2023   T3FREE 4.2 05/17/2023   T3FREE 5.3 (H) 04/03/2023   Antithyroid antibodies: Lab Results  Component Value Date   TSI 101 10/04/2023   TSI <89 05/17/2023   TSI <89 04/03/2023   Recent white blood cell count was normal: Lab Results  Component Value Date   WBC 8.1 04/03/2023   HGB 16.3 04/03/2023   HCT 51.2 04/03/2023   MCV 71.6 (L) 04/03/2023   PLT 475.0 (H) 04/03/2023   His ESR was normal: Lab Results  Component Value Date   ESRSEDRATE 2 05/17/2023   Pt denies: - feeling nodules in neck - hoarseness - dysphagia - choking  Pt does have a FH of thyroid  ds.: M and older sister: hypothyroidism. No FH of thyroid  cancer. No h/o radiation tx to head or neck. No steroid use. No herbal supplements. No Biotin use.  No amiodarone  use.  Pt. also has a history of anemia, hyperglycemia. Lab Results  Component Value Date   GLUCOSE 88 07/03/2023   GLUCOSE 90 03/22/2023   GLUCOSE 105 (H) 03/01/2018   GLUCOSE 120 (H) 01/16/2008   Lab Results  Component Value Date   HGBA1C 5.9 10/04/2023   He had several fractures in high school playing football.  He smokes MJ.  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[employer] , janitorial  Tobacco Use   Smoking status: Some Days    Types: Cigarettes, Cigars   Smokeless tobacco: Never   Tobacco comments:    About 2 black and milds a week  Vaping Use   Vaping status: Never Used  Substance and Sexual Activity   Alcohol use: Yes    Comment: liquor 1 drink 2x a week  Drug use: Yes    Frequency: 5.0 times per week    Types: Marijuana   Sexual activity: Yes    Partners: Female    Birth control/protection: OCP  Other Topics Concern   Not on file  Social History Narrative   Six year old daughter (2024)   Social Drivers of Corporate investment banker Strain: Not on file  Food Insecurity: Not on file  Transportation Needs: Not on file  Physical Activity: Not on file  Stress: Not on file  Social Connections: Not on file  Intimate Partner Violence: Not on file   Current Outpatient Medications on File Prior to Visit  Medication Sig Dispense Refill   amLODipine  (NORVASC ) 2.5 MG tablet Take one tablet along with one 5 mg tablet (total 7.5 mg once daily) 90 tablet 3   amLODipine  (NORVASC ) 5  MG tablet Take 1 tablet (5 mg total) by mouth daily. 90 tablet 3   No current facility-administered medications on file prior to visit.   No Known Allergies Family History  Problem Relation Age of Onset   Thyroid  disease Mother    Hypertension Mother    Breast cancer Mother    Hypertension Father    Heart disease Father    Thyroid  disease Sister    Hypertension Sister    Hypertension Maternal Grandmother    PE: BP 136/80   Pulse 96   Ht 5' 6 (1.676 m)   Wt 222 lb (100.7 kg)   SpO2 97%   BMI 35.83 kg/m  HR at the end of the appt: 88 bpm Wt Readings from Last 10 Encounters:  10/10/23 222 lb (100.7 kg)  10/04/23 229 lb (103.9 kg)  07/03/23 230 lb 12.8 oz (104.7 kg)  05/17/23 229 lb 3.2 oz (104 kg)  04/04/23 228 lb (103.4 kg)  03/22/23 230 lb (104.3 kg)  03/01/18 200 lb (90.7 kg)  04/01/17 205 lb 9.6 oz (93.3 kg)  10/12/12 188 lb (85.3 kg)  11/23/11 188 lb (85.3 kg)   Constitutional: overweight, in NAD Eyes: EOMI, no exophthalmos, no lid lag, no stare ENT: no thyromegaly, no thyroid  bruits, no cervical lymphadenopathy Cardiovascular: RRR, No MRG Respiratory: CTA B Musculoskeletal: no deformities Skin: no rashes Neurological: no tremor with outstretched hands  ASSESSMENT: 1. Thyrotoxicosis  PLAN:  1. Patient with a history of a mildly low TSH, with normal free thyroid  hormones and TSI antibodies.  He had thyrotoxic signs or symptoms, heat intolerance and perspiration, increased blood pressure, but without tremors, hyperdefecation, anxiety.  He had palpitations but only when drinking energy drinks.  He did not appear to have exogenous causes for the low TSH. -At last visit and again today, we discussed that possible causes of thyrotoxicosis are: Graves ds -his TSI antibodies were not elevated, but these do not completely rule out mild forms of Graves' disease Thyroiditis -less likely, since an ESR checked at last visit was normal toxic multinodular goiter/ toxic  adenoma - I did not feel nodules of his thyroid , however, since his TSH remains low, we could check a thyroid  uptake and scan - At today's visit, we reviewed together his previous thyroid  tests and it appears that his TSH remains stable, but it is still slightly low.  In this case, we will go ahead with a thyroid  uptake and scan.  We discussed about possible results and we also previously discussed about different modalities of treatment for the above conditions to include methimazole use, RAI treatment, or, last result, surgery.  -  At today's visit, he is not tachycardic (88 bpm at the end of the visit).  We did discuss at last visit about not drinking energy drinks to avoid overstimulating his heart.  He was able to stop drinking them since last visit.  He is feeling better. - We may need to do a thyroid  ultrasound depending on the results of the thyroid  uptake and scan (if a cold nodule is present) -Since he is not tachycardic or tremulous, would not add a beta-blocker at this time -He denies active signs of Graves' ophthalmopathy to include double vision, blurred vision, eye pain, chemosis -I will see him back in approximately 5 months (the end of the year)  Orders Placed This Encounter  Procedures   NM THYROID  MULT UPTAKE W/IMAGING   Lela Fendt, MD PhD [facility] Endocrinology

## 2023-10-28 ENCOUNTER — Encounter (HOSPITAL_COMMUNITY)

## 2023-10-28 ENCOUNTER — Encounter (HOSPITAL_COMMUNITY): Admission: RE | Admit: 2023-10-28 | Source: Ambulatory Visit

## 2023-10-29 ENCOUNTER — Encounter (HOSPITAL_COMMUNITY)

## 2023-12-18 ENCOUNTER — Ambulatory Visit: Attending: Obstetrics and Gynecology

## 2023-12-18 DIAGNOSIS — Z8481 Family history of carrier of genetic disease: Secondary | ICD-10-CM

## 2023-12-18 DIAGNOSIS — Z31440 Encounter of male for testing for genetic disease carrier status for procreative management: Secondary | ICD-10-CM

## 2023-12-19 ENCOUNTER — Encounter: Payer: Self-pay | Admitting: Dietician

## 2023-12-19 ENCOUNTER — Encounter: Attending: Family | Admitting: Dietician

## 2023-12-19 DIAGNOSIS — E782 Mixed hyperlipidemia: Secondary | ICD-10-CM | POA: Insufficient documentation

## 2023-12-19 DIAGNOSIS — Z713 Dietary counseling and surveillance: Secondary | ICD-10-CM | POA: Insufficient documentation

## 2023-12-19 NOTE — Progress Notes (Signed)
 Medical Nutrition Therapy  Appointment Start time:  1400  Appointment End time:  1510  Primary concerns today: High Cholesterol  Referral diagnosis: E78.2 - Mixed Hyperlipidemia Preferred learning style: No preference indicated Learning readiness: Ready   NUTRITION ASSESSMENT    Clinical Medical Hx: HLD, HTN, Hyperthyroidism Medications: Amlodipine  Labs: Cholesterol - 274, LDL - 215, TSH - 0.28, A1c - 5.9% Notable Signs/Symptoms: None   Lifestyle & Dietary Hx Pt reports desire to lower cholesterol and lose weight, reports weight gain over the last year, states their knees have begun to bother them since gaining weight. Pt states they tend to eat larger portions than they need when eating, tends to eat a lot of fast food and meals away from home, eats out 4 days a week for lunch and a few days a week for dinner. Pt reports not being a breakfast eater, will have a protein shake sometimes. 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    Estimated daily fluid intake: >64 oz Supplements: N/A Sleep: Sleeps well Stress / self-care: High, worry at work Current average weekly physical activity: ADLs, somewhat active at work, walks dog occasionally   24-Hr Dietary Recall First Meal: Protein shake, iced coffee w/ 10-20 pumps of syrup, oatmilk creamer Snack:  Second Meal: 1/2 Sweet onion chicken teriyaki sub w/ lettuce, tomatoes, pickles, shredded cheese, honey mustard, water Snack:  Third Meal: 1/2 Sweet onion chicken teriyaki sub w/ lettuce, tomatoes, pickles, shredded cheese, honey mustard, water Snack: 2 Reese's mini cups Beverages: Coffee, water    NUTRITION DIAGNOSIS  NB-1.1 Food and nutrition-related knowledge deficit As related to HLD.  As  evidenced by Cholesterol of 272 mg/dL, LDL of 784 mg/dL, dietary history high in saturated fats.   NUTRITION INTERVENTION  Nutrition education (E-1) on the following topics:  Educate pt on the difference between LDL and HDL cholesterol.  Educate pt on the factors that can increase and decrease HDL cholesterol, including exercise to increase HDL, and tobacco use to decrease HDL.  Educate pt on factors that can elevate LDL cholesterol, including high dietary intake of saturated fats. Educate pt on identifying sources of saturated fats, and how to make alternative food choices to lower saturated fat intake. Educate pt on the role of soluble fiber in binding to cholesterol in the GI tract an eliminating it from the body. Educate pt on dietary sources of soluble fiber.  Educate pt on the potential dietary causes of hypertriglyceridemia, including concentrated sugars and sugar sweetened beverages. Educate on the role of elevated LDL,total cholesterol, and triglycerides on cardiovascular health. Educate pt on the role of physical activity in lowering LDL and increasing HDL cholesterol.   Handouts Provided Include  LDL Cholesterol Lowering Therapy Saturated Fats Worksheet  Learning Style & Readiness for Change Teaching method utilized: Visual & Auditory  Demonstrated degree of understanding via: Teach Back  Barriers to learning/adherence to lifestyle change: None  Goals Established by Pt Use a combination of these three strategies to effectively lower your consumption of saturated fats 1) Consume a smaller portion when having animal products 2) Consume high saturated fat foods less frequently 3) Find a reduced or low saturated fat version Read your nutrition labels on your evening snacks! Try to get choices that are 1g or less  in saturated fat.  Add in 60 minutes of activity on your 2 days off from work.    MONITORING & EVALUATION Dietary intake, weekly physical activity, and labs in 2  months.  Next Steps  Patient is to follow up with RD.

## 2023-12-19 NOTE — Patient Instructions (Addendum)
 Use a combination of these three strategies to effectively lower your consumption of saturated fats 1) Consume a smaller portion when having animal products 2) Consume high saturated fat foods less frequently 3) Find a reduced or low saturated fat version  Read your nutrition labels on your evening snacks! Try to get choices that are 1g or less in saturated fat.   Add in 60 minutes of activity on your 2 days off from work.

## 2023-12-29 LAB — HORIZON CUSTOM: REPORT SUMMARY: NEGATIVE

## 2023-12-30 ENCOUNTER — Telehealth: Payer: Self-pay

## 2023-12-30 NOTE — Telephone Encounter (Signed)
 Per patient's request, I spoke with his partner (Aloysius Dover DOB 07/05/1994) to return his carrier screening results. He was not found to be a carrier for SMA. Please see report for details. A negative result on carrier screening reduces but does not eliminate the chance of being a carrier. The chance this couple's current and future pregnancies would be affected with SMA is very low.   Lauraine Bodily, MS, [facility] Certified Genetic Counselor [facility] [phone]

## 2024-01-03 ENCOUNTER — Ambulatory Visit (INDEPENDENT_AMBULATORY_CARE_PROVIDER_SITE_OTHER): Admitting: Family

## 2024-01-03 ENCOUNTER — Encounter: Payer: Self-pay | Admitting: Family

## 2024-01-03 ENCOUNTER — Encounter: Payer: Self-pay | Admitting: Internal Medicine

## 2024-01-03 VITALS — BP 122/84 | HR 84 | Temp 98.7°F | Ht 66.0 in | Wt 220.6 lb

## 2024-01-03 DIAGNOSIS — B070 Plantar wart: Secondary | ICD-10-CM

## 2024-01-03 DIAGNOSIS — I159 Secondary hypertension, unspecified: Secondary | ICD-10-CM

## 2024-01-03 DIAGNOSIS — Z0000 Encounter for general adult medical examination without abnormal findings: Secondary | ICD-10-CM | POA: Diagnosis not present

## 2024-01-03 DIAGNOSIS — Z23 Encounter for immunization: Secondary | ICD-10-CM

## 2024-01-03 DIAGNOSIS — E782 Mixed hyperlipidemia: Secondary | ICD-10-CM | POA: Diagnosis not present

## 2024-01-03 NOTE — Progress Notes (Signed)
 Subjective:  Patient ID: Patrick Atkins, male    DOB: 11-29-1990  Age: 33 y.o. MRN: [id number]  Patient Care Team: Corwin Antu, FNP as PCP - General (Family Medicine)   CC:  Chief Complaint  Patient presents with   Annual Exam    HPI Patrick Atkins is a 33 y.o. male who presents today for an annual physical exam. He reports consuming a general diet. Pretty active with his job, walks on campus and walks his dog regularly He generally feels well. He reports sleeping well. He does have additional problems to discuss today.   Vision:Within last year Dental:Receives regular dental care  Pt is with acute concerns.   Discussed the use of AI scribe software for clinical note transcription with the patient, who gave verbal consent to proceed.  History of Present Illness Patrick Atkins is a 33 year old male who presents for an annual physical exam.  He has hypertension and is currently taking 5 mg and 2.5 mg doses of medication, which have improved his symptoms. He does not regularly monitor his blood pressure at home.  His TSH is low, and he reports that his thyroid  numbers have stayed consistent according to his endocrinologist. He canceled a thyroid  uptake and scan due to cost concerns and has not informed his healthcare provider.  He has a plantar wart on the ball of his left foot, previously treated with cryotherapy, which provided only temporary relief.  He has a history of elevated cholesterol levels, which he describes as 'ugly.' He has consulted with a dietitian and plans to follow up in December.  He is up to date with his eye and dental exams, with a dental appointment scheduled for November. He has received his flu vaccine and is due for a tetanus booster today.  He works in athletics with a physically demanding job, involving tasks such as Engineer, agricultural rooms. His fast-paced and hard walking style contributes to the rapid wear of his shoes.   Advanced Directives Patient does  not have advanced directives    DEPRESSION SCREENING    12/19/2023    1:59 PM 10/04/2023    7:48 AM 07/03/2023    7:23 AM 03/22/2023    8:38 AM  PHQ 2/9 Scores  PHQ - 2 Score 0 1 0 0  PHQ- 9 Score  2 0 2     ROS: Negative unless specifically indicated above in HPI.    Current Outpatient Medications:    amLODipine  (NORVASC ) 2.5 MG tablet, Take one tablet along with one 5 mg tablet (total 7.5 mg once daily), Disp: 90 tablet, Rfl: 3   amLODipine  (NORVASC ) 5 MG tablet, Take 1 tablet (5 mg total) by mouth daily., Disp: 90 tablet, Rfl: 3    Objective:    BP 122/84 (BP Location: Left Arm, Patient Position: Sitting, Cuff Size: Large)   Pulse 84   Temp 98.7 F (37.1 C) (Temporal)   Ht 5' 6 (1.676 m)   Wt 220 lb 9.6 oz (100.1 kg)   SpO2 98%   BMI 35.61 kg/m   BP Readings from Last 3 Encounters:  01/03/24 122/84  10/10/23 136/80  10/04/23 122/76      Physical Exam Vitals reviewed.  Musculoskeletal:     Comments: Left hand with two warts  One on right medial aspect of thumb and one on left base of second and third metacarpal   Feet:     Comments: Plantar wart mid forefoot left      Results LABS  TSI antibodies: not elevated Sed rate: normal  Procedure: Cryotherapy for plantar wart Description: Cryotherapy applied to plantar wart on the ball of the left foot. Area turned white, indicating effective freezing.  Procedure: Cryotherapy for hand warts Description: Cryotherapy applied to warts on the hand. Minimal application due to sensitivity.      Assessment & Plan:   Assessment and Plan Assessment & Plan Secondary hypertension Blood pressure is well-controlled at 122/84 mmHg with Amlodipine  5 mg and 2.5 mg daily. Potential for blood pressure to decrease if thyroid  condition improves, as hypertension is secondary to thyroid  disorder. - Continue Amlodipine  regimen. - Monitor for symptoms of hypotension, especially if thyroid  condition improves.  Mixed  hyperlipidemia Previous cholesterol levels were significantly elevated. He has seen a dietitian and plans to follow up in December. - Recheck lipid panel in three months.  Thyroid  disorder under evaluation (possible Graves disease/toxic goiter) Possible Graves disease or toxic goiter. TSI antibodies not elevated, and thyroiditis less likely due to normal sed rate. Thyroid  uptake and scan recommended but not completed due to cost. Potential need for radioactive iodine treatment if hyperthyroidism confirmed. - Advise contacting endocrinologist to discuss cost concerns and importance of thyroid  uptake and scan. - Monitor thyroid  function and symptoms.  Plantar wart, left foot Plantar wart on the ball of the left foot with significant discomfort indicated by rapid wear of shoes. Previous cryotherapy provided temporary relief. - Perform cryotherapy on plantar wart. - Refer to podiatry for further evaluation and management. Pt gave verbal consent prior. Scalpel used to par wart prior to treatment with cryotherapy. Cryotherapy blast applied until white bead formation x 2 rounds. Advised pt to apply Aquaphor otherwise no other treatment needed. Educated will likely start to resolve within 4-6 days, may blister and or slough off however monitor for s/s infection. Pt tolerated procedure well  May need repeat rounds of cryotherapy pt knows to schedule f/u if repeat session needed.  Handout given to pt as well.   Warts, fingers Warts present on fingers with some discomfort. Previous cryotherapy performed. - Perform cryotherapy on finger warts.  General Health Maintenance Up to date with eye and dental exams. Received flu vaccine. Tetanus vaccine last received in 2014, due for renewal. Discussed HPV vaccination, opted to defer. STD testing offered and agreed upon. - Administer Tdap vaccine. - Conduct STD testing.   Patient Counseling(The following topics were reviewed):  Preventative care handout  given to pt  Health maintenance and immunizations reviewed. Please refer to Health maintenance section. Pt advised on safe sex, wearing seatbelts in car, and proper nutrition labwork ordered today for annual Dental health: Discussed importance of regular tooth brushing, flossing, and dental visits.     Follow-up: Return in about 6 months (around 07/03/2024) for f/u blood pressure.   Ginger Patrick, FNP

## 2024-01-16 ENCOUNTER — Ambulatory Visit: Admitting: Podiatry

## 2024-01-16 DIAGNOSIS — Q666 Other congenital valgus deformities of feet: Secondary | ICD-10-CM

## 2024-01-16 DIAGNOSIS — L989 Disorder of the skin and subcutaneous tissue, unspecified: Secondary | ICD-10-CM | POA: Diagnosis not present

## 2024-01-16 NOTE — Progress Notes (Signed)
  Subjective:  Patient ID: Patrick Atkins, male    DOB: [birth year],  MRN: [id number]  Chief Complaint  Patient presents with   Callouses    33 y.o. male presents with the above complaint.  Patient presents with left submetatarsal 2 benign skin lesion.  He states is painful to touch is progressing and worse worse with ambulation is with pressure wanted get it evaluated he does not wear any orthotics he has a flatfoot deformity.  Pain scale 7 out of 10 left dull aching nature   Review of Systems: Negative except as noted in the HPI. Denies N/V/F/Ch.  Past Medical History:  Diagnosis Date   Seasonal allergies     Current Outpatient Medications:    amLODipine  (NORVASC ) 2.5 MG tablet, Take one tablet along with one 5 mg tablet (total 7.5 mg once daily), Disp: 90 tablet, Rfl: 3   amLODipine  (NORVASC ) 5 MG tablet, Take 1 tablet (5 mg total) by mouth daily., Disp: 90 tablet, Rfl: 3  Social History   Tobacco Use  Smoking Status Some Days   Types: Cigarettes, Cigars  Smokeless Tobacco Never  Tobacco Comments   About 2 black and milds a week    No Known Allergies Objective:  There were no vitals filed for this visit. There is no height or weight on file to calculate BMI. Constitutional Well developed. Well nourished.  Vascular Dorsalis pedis pulses palpable bilaterally. Posterior tibial pulses palpable bilaterally. Capillary refill normal to all digits.  No cyanosis or clubbing noted. Pedal hair growth normal.  Neurologic Normal speech. Oriented to person, place, and time. Epicritic sensation to light touch grossly present bilaterally.  Dermatologic Left submetatarsal 2 benign skin lesion with central nucleated core.  No pinpoint bleeding noted upon debridement no signs of verruca noted.  Pes planovalgus foot structure noted with calcaneovalgus to many toe signs partial ability to the arch with dorsiflexing the hallux unable to perform single and double heel raise.  Orthopedic: Normal  joint ROM without pain or crepitus bilaterally. No visible deformities. No bony tenderness.   Radiographs: None Assessment:   1. Benign skin lesion   2. Pes planovalgus    Plan:  Patient was evaluated and treated and all questions answered.  Left foot benign skin lesion submetatarsal 2 --Lesion was debrided today without complications. Hemostasis was achieved and the area was cleaned. Cantharone was applied followed by an occlusive bandage. Post procedure complications were discussed. Monitor for signs or symptoms of infection and directed to call the office mainly should any occur.   Pes planovalgus/foot deformity -I explained to patient the etiology of pes planovalgus and relationship with heel pain/arch pain and various treatment options were discussed.  Given patient foot structure in the setting of heel pain/arch pain I believe patient will benefit from custom-made orthotics to help control the hindfoot motion support the arch of the foot and take the stress away from arches.  Patient agrees with the plan like to proceed with orthotics -Patient was casted for orthotics bilaterally offloading of submetatarsal 2   No follow-ups on file.

## 2024-01-30 ENCOUNTER — Ambulatory Visit: Admitting: Podiatry

## 2024-02-06 ENCOUNTER — Ambulatory Visit: Admitting: Podiatry

## 2024-03-05 ENCOUNTER — Ambulatory Visit: Admitting: Dietician

## 2024-03-20 ENCOUNTER — Ambulatory Visit: Admitting: Internal Medicine

## 2024-03-27 ENCOUNTER — Ambulatory Visit: Admitting: Internal Medicine

## 2024-03-27 NOTE — Progress Notes (Deleted)
 Patient ID: Patrick Atkins, male   DOB: September 09, 1990, 34 y.o.   MRN: [id number]  HPI  Collen Atkins is a 34 y.o.-year-old male, initially referred by his PCP, Corwin Antu, FNP, returning for follow-up for thyrotoxicosis.  Last visit 5 months ago.  Interim history: He denies tremors, palpitations, heat intolerance, unintentional weight loss, anxiety. He cut out energy drinks before last visit >> feeling better.  His heat intolerance resolved.  Reviewed history: Patient was found to have a decreased TSH along with an increased free T3 and normal free T4 in 02/2023.  At that time, he presented to establish care with his primary care doctor but had HTN and increased perspiration. No consistent palpitations (only when drinking energy drinks), no tremors or weight loss.  At our initial visit, he was feeling well, but with persistent heat intolerance.  I reviewed pt's thyroid  tests: Lab Results  Component Value Date   TSH 0.28 (L) 10/04/2023   TSH 0.27 (L) 05/17/2023   TSH 0.32 (L) 03/22/2023   FREET4 0.90 10/04/2023   FREET4 1.2 05/17/2023   FREET4 0.90 04/03/2023   T3FREE 3.9 10/04/2023   T3FREE 4.2 05/17/2023   T3FREE 5.3 (H) 04/03/2023   Antithyroid antibodies: Lab Results  Component Value Date   TSI 101 10/04/2023   TSI <89 05/17/2023   TSI <89 04/03/2023   Recent white blood cell count was normal: Lab Results  Component Value Date   WBC 8.1 04/03/2023   HGB 16.3 04/03/2023   HCT 51.2 04/03/2023   MCV 71.6 (L) 04/03/2023   PLT 475.0 (H) 04/03/2023   His ESR was normal: Lab Results  Component Value Date   ESRSEDRATE 2 05/17/2023   Pt denies: - feeling nodules in neck - hoarseness - dysphagia - choking  Pt does have a FH of thyroid  ds.: M and older sister: hypothyroidism. No FH of thyroid  cancer. No h/o radiation tx to head or neck. No steroid use. No Biotin use.  No amiodarone use.  Pt. also has a history of anemia, hyperglycemia: Lab Results  Component Value Date    GLUCOSE 88 07/03/2023   GLUCOSE 90 03/22/2023   GLUCOSE 105 (H) 03/01/2018   GLUCOSE 120 (H) 01/16/2008   Lab Results  Component Value Date   HGBA1C 5.9 10/04/2023   He had several fractures in high school playing football.  He smokes MJ.  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[employer] , janitorial  Tobacco Use   Smoking status: Some Days    Types: Cigarettes, Cigars   Smokeless tobacco: Never   Tobacco comments:    About 2 black and milds a week  Vaping Use   Vaping status: Never Used  Substance and Sexual Activity   Alcohol use: Yes    Comment: liquor 1 drink 2x a week   Drug use: Yes    Frequency: 5.0 times per week    Types: Marijuana  Sexual activity: Yes    Partners: Female    Birth control/protection: OCP  Other Topics Concern   Not on file  Social History Narrative   Six year old daughter (2024)   Social Drivers of Health   Tobacco Use: High Risk (01/03/2024)   Patient History    Smoking Tobacco Use: Some Days    Smokeless Tobacco Use: Never    Passive Exposure: Not on file  Financial Resource Strain: Not on file  Food Insecurity: Not on file  Transportation Needs: Not on file  Physical Activity: Not on file  Stress: Not on file  Social Connections: Not on file  Intimate Partner Violence: Not on file  Depression (PHQ2-9): Low Risk (12/19/2023)   Depression (PHQ2-9)    PHQ-2 Score: 0  Alcohol Screen: Not on file  Housing: Not on file  Utilities: Not on file   Health Literacy: Not on file   Current Outpatient Medications on File Prior to Visit  Medication Sig Dispense Refill   amLODipine  (NORVASC ) 2.5 MG tablet Take one tablet along with one 5 mg tablet (total 7.5 mg once daily) 90 tablet 3   amLODipine  (NORVASC ) 5 MG tablet Take 1 tablet (5 mg total) by mouth daily. 90 tablet 3   No current facility-administered medications on file prior to visit.   No Known Allergies Family History  Problem Relation Age of Onset   Thyroid  disease Mother    Hypertension Mother    Breast cancer Mother    Hypertension Father    Heart disease Father    Thyroid  disease Sister    Hypertension Sister    Hypertension Maternal Grandmother    PE: There were no vitals taken for this visit.  Wt Readings from Last 10 Encounters:  01/03/24 220 lb 9.6 oz (100.1 kg)  10/10/23 222 lb (100.7 kg)  10/04/23 229 lb (103.9 kg)  07/03/23 230 lb 12.8 oz (104.7 kg)  05/17/23 229 lb 3.2 oz (104 kg)  04/04/23 228 lb (103.4 kg)  03/22/23 230 lb (104.3 kg)  03/01/18 200 lb (90.7 kg)  04/01/17 205 lb 9.6 oz (93.3 kg)  10/12/12 188 lb (85.3 kg)   Constitutional: overweight, in NAD Eyes: EOMI, no exophthalmos, no lid lag, no stare ENT: no thyromegaly, no thyroid  bruits, no cervical lymphadenopathy Cardiovascular: RRR, No MRG Respiratory: CTA B Musculoskeletal: no deformities Skin: no rashes Neurological: no tremor with outstretched hands  ASSESSMENT: 1. Thyrotoxicosis  PLAN:  1. Patient with a history of a mildly low TSH, with normal free thyroid  hormones and TSI antibodies.  He has thyrotoxic signs and symptoms including heat intolerance and perspiration, increased blood pressure, but without tremors, hyperdefecation, anxiety.  He had palpitations but only when drinking energy drinks. - he did not appear to have exogenous causes for his thyrotoxicosis - we reviewed together possible causes of thyrotoxicosis to include: Graves' disease-she is TSI antibodies were not  elevated but these do not completely rule out mild forms of Graves' disease Thyroiditis-less likely, since an ESR was normal Toxic multinodular goiter/toxic adenoma-I did not feel nodules on palpation of his thyroid  but we discussed that we can only rule out the presence of hot or warm nodules by doing a thyroid  uptake and scan. - At last visit, his TSH was stable, but still slightly low so I suggested the thyroid  uptake and scan.  We discussed about possible results and also possible modalities of treatment of the above conditions to include methimazole use, RAI treatment, or, last resort, surgery.  However, he did not go through with the thyroid  uptake and scan. - We previously discussed about stopping energy drinks and he did so before last visit.  He was feeling better. - At today's visit, his heart rate is normal.  Since he is not tachycardic or tremulous, I do not suggest a beta-blocker. - he has no active signs of Graves' ophthalmopathy: No blurry vision, double vision, eye pain, chemosis - I will see him back in 6 months I will be in touch with him about the results  No orders of the defined types were placed in this encounter.  Lela Fendt, MD PhD [facility] Endocrinology

## 2024-04-06 ENCOUNTER — Other Ambulatory Visit

## 2024-04-06 DIAGNOSIS — E782 Mixed hyperlipidemia: Secondary | ICD-10-CM | POA: Diagnosis not present

## 2024-04-06 LAB — LIPID PANEL
Cholesterol: 234 mg/dL — ABNORMAL HIGH (ref 28–200)
HDL: 47.5 mg/dL
LDL Cholesterol: 172 mg/dL — ABNORMAL HIGH (ref 10–99)
NonHDL: 186.65
Total CHOL/HDL Ratio: 5
Triglycerides: 75 mg/dL (ref 10.0–149.0)
VLDL: 15 mg/dL (ref 0.0–40.0)

## 2024-04-07 ENCOUNTER — Ambulatory Visit: Admitting: Podiatry

## 2024-04-08 ENCOUNTER — Ambulatory Visit: Payer: Self-pay | Admitting: Family

## 2024-04-14 ENCOUNTER — Ambulatory Visit: Admitting: Podiatry

## 2024-04-14 ENCOUNTER — Encounter: Attending: Family | Admitting: Dietician

## 2024-04-14 DIAGNOSIS — E782 Mixed hyperlipidemia: Secondary | ICD-10-CM

## 2024-04-14 DIAGNOSIS — L989 Disorder of the skin and subcutaneous tissue, unspecified: Secondary | ICD-10-CM

## 2024-04-14 NOTE — Patient Instructions (Addendum)
 Try Fair life milk (2% or skim).  Try to eat something small breakfast (fruit, yogurt shake, premier shake).  Choose whole grain or whole wheat options for your sandwiches and crackers.  Challenge and trust yourself to choose healthier options at [employer]!  Keep up the great work with your activity! More is MORE!

## 2024-04-14 NOTE — Progress Notes (Unsigned)
 Medical Nutrition Therapy  Appointment Start time:  1630  Appointment End time:  1705  Primary concerns today: High Cholesterol  Referral diagnosis: E78.2 - Mixed Hyperlipidemia Preferred learning style: No preference indicated Learning readiness: Change in Progress   NUTRITION ASSESSMENT    Clinical Medical Hx: HLD, HTN, Hyperthyroidism Medications: Amlodipine  Labs: Cholesterol - 274, LDL - 215, TSH - 0.28, A1c - 5.9% NEW (04/06/2024): Cholesterol - 234, LDL - 172 Notable Signs/Symptoms: None   Lifestyle & Dietary Hx Charting completed by dietetic intern, Myrla Gardener Pt reports usually eating twice daily, still not eating breakfast often, usually stops eating around 7:00 PM, except for when working late on wed & fri, pt will get peanuts with water for a snack in the evening. Pt reports cutting back on fast foods and sweet tea, incorporating more fruits and vegetables for snacks (celery, raisins, pears), and using oat milk and lactaid in place of regular milk.  Pt reports implementing strategies to walk more steps on a consistent basis since last visit (parking further away, walking dog longer). Pt reports getting 15-20k steps most days.  Pt reports finding alternative coping mechanisms for daily stressors (reading bible and self-help books, meditating), states these have been helpful for problem solving.   Estimated daily fluid intake: >64 oz Supplements: N/A Sleep: Sleeps well Stress / self-care: Lower, improved with reading/meditating Current average weekly physical activity: ADLs, somewhat active at work, walks dog, 15-20k steps most days   24-Hr Dietary Recall First Meal: Chicken salad sandwich on croissant with pickle Snack:  Second Meal: grilled chicken, bacon, red onions Snack:  Third Meal:  Snack:  Beverages: water    NUTRITION DIAGNOSIS  NB-1.1 Food and nutrition-related knowledge deficit As related to HLD.  As evidenced by Cholesterol of 234 mg/dL, LDL of  827 mg/dL, dietary history high in saturated fats.   NUTRITION INTERVENTION  Nutrition education (E-1) on the following topics:  Educate pt on the difference between LDL and HDL cholesterol.  Educate pt on the factors that can increase and decrease HDL cholesterol, including exercise to increase HDL, and tobacco use to decrease HDL.  Educate pt on factors that can elevate LDL cholesterol, including high dietary intake of saturated fats. Educate pt on identifying sources of saturated fats, and how to make alternative food choices to lower saturated fat intake. Educate pt on the role of soluble fiber in binding to cholesterol in the GI tract an eliminating it from the body. Educate pt on dietary sources of soluble fiber.  Educate pt on the potential dietary causes of hypertriglyceridemia, including concentrated sugars and sugar sweetened beverages. Educate on the role of elevated LDL,total cholesterol, and triglycerides on cardiovascular health. Educate pt on the role of physical activity in lowering LDL and increasing HDL cholesterol.   Handouts Provided Include  LDL Cholesterol Lowering Therapy Saturated Fats Worksheet  Learning Style & Readiness for Change Teaching method utilized: Visual & Auditory  Demonstrated degree of understanding via: Teach Back  Barriers to learning/adherence to lifestyle change: None  Goals Established by Pt Try Fair life milk (2% or skim). Try to eat something small breakfast (fruit, yogurt shake, premier shake). Choose whole grain or whole wheat options for your sandwiches and crackers. Challenge and trust yourself to choose healthier options at [employer]! Keep up the great work with your activity! More is MORE!   MONITORING & EVALUATION Dietary intake, weekly physical activity, and labs in 2 months.  Next Steps  Patient is to follow up with RD.

## 2024-04-14 NOTE — Progress Notes (Signed)
"  °  Subjective:  Patient ID: Patrick Atkins, male    DOB: [birth year],  MRN: [id number]  Chief Complaint  Patient presents with   Plantar Warts    34 y.o. male presents with the above complaint.  Patient presents with left submetatarsal 2 benign skin lesion.  He states is painful to touch is progressing and worse worse with ambulation is with pressure wanted get it evaluated he does not wear any orthotics he has a flatfoot deformity.  Pain scale 7 out of 10 left dull aching nature   Review of Systems: Negative except as noted in the HPI. Denies N/V/F/Ch.  Past Medical History:  Diagnosis Date   Seasonal allergies     Current Outpatient Medications:    amLODipine  (NORVASC ) 2.5 MG tablet, Take one tablet along with one 5 mg tablet (total 7.5 mg once daily), Disp: 90 tablet, Rfl: 3   amLODipine  (NORVASC ) 5 MG tablet, Take 1 tablet (5 mg total) by mouth daily., Disp: 90 tablet, Rfl: 3  Social History   Tobacco Use  Smoking Status Some Days   Types: Cigarettes, Cigars  Smokeless Tobacco Never  Tobacco Comments   About 2 black and milds a week    No Known Allergies Objective:  There were no vitals filed for this visit. There is no height or weight on file to calculate BMI. Constitutional Well developed. Well nourished.  Vascular Dorsalis pedis pulses palpable bilaterally. Posterior tibial pulses palpable bilaterally. Capillary refill normal to all digits.  No cyanosis or clubbing noted. Pedal hair growth normal.  Neurologic Normal speech. Oriented to person, place, and time. Epicritic sensation to light touch grossly present bilaterally.  Dermatologic Left submetatarsal 2 benign skin lesion with central nucleated core.  No pinpoint bleeding noted upon debridement no signs of verruca noted.  Pes planovalgus foot structure noted with calcaneovalgus to many toe signs partial ability to the arch with dorsiflexing the hallux unable to perform single and double heel raise.  Orthopedic:  Normal joint ROM without pain or crepitus bilaterally. No visible deformities. No bony tenderness.   Radiographs: None Assessment:   1. Benign skin lesion     Plan:  Patient was evaluated and treated and all questions answered.  Left foot benign skin lesion submetatarsal 2 --Lesion was debrided today without complications. Hemostasis was achieved and the area was cleaned. Cantharone was applied followed by an occlusive bandage. Post procedure complications were discussed. Monitor for signs or symptoms of infection and directed to call the office mainly should any occur.   Pes planovalgus/foot deformity -I explained to patient the etiology of pes planovalgus and relationship with heel pain/arch pain and various treatment options were discussed.  Given patient foot structure in the setting of heel pain/arch pain I believe patient will benefit from custom-made orthotics to help control the hindfoot motion support the arch of the foot and take the stress away from arches.  Patient agrees with the plan like to proceed with orthotics -Patient was casted for orthotics bilaterally offloading of submetatarsal 2   No follow-ups on file. "

## 2024-04-15 ENCOUNTER — Encounter: Payer: Self-pay | Admitting: Dietician

## 2024-04-17 ENCOUNTER — Ambulatory Visit: Admitting: Internal Medicine

## 2024-04-17 ENCOUNTER — Encounter: Payer: Self-pay | Admitting: Internal Medicine

## 2024-04-28 ENCOUNTER — Ambulatory Visit: Admitting: Podiatry

## 2024-04-28 DIAGNOSIS — L989 Disorder of the skin and subcutaneous tissue, unspecified: Secondary | ICD-10-CM

## 2024-05-01 ENCOUNTER — Telehealth: Payer: Self-pay | Admitting: Podiatry

## 2024-05-01 NOTE — Telephone Encounter (Signed)
 Ortho in [place], lvm for patient to call and schedule [place] in [place], shipped to [place] 05/01/2024

## 2024-05-12 ENCOUNTER — Ambulatory Visit: Admitting: Podiatry

## 2024-06-16 ENCOUNTER — Encounter: Admitting: Dietician

## 2024-07-03 ENCOUNTER — Ambulatory Visit: Admitting: Family

## 2025-01-04 ENCOUNTER — Encounter: Admitting: Family
